# Patient Record
Sex: Female | Born: 1955 | Race: White | Hispanic: No | Marital: Married | Smoking: Never smoker
Health system: Southern US, Community
[De-identification: ages and names within clinical notes are randomized; demographics above are authoritative.]

## PROBLEM LIST (undated history)

## (undated) DIAGNOSIS — Q249 Congenital malformation of heart, unspecified: Secondary | ICD-10-CM

## (undated) DIAGNOSIS — Z2913 Encounter for prophylactic Rho(D) immune globulin: Secondary | ICD-10-CM

## (undated) DIAGNOSIS — B1920 Unspecified viral hepatitis C without hepatic coma: Secondary | ICD-10-CM

## (undated) DIAGNOSIS — T7840XA Allergy, unspecified, initial encounter: Secondary | ICD-10-CM

## (undated) DIAGNOSIS — B029 Zoster without complications: Secondary | ICD-10-CM

## (undated) DIAGNOSIS — J301 Allergic rhinitis due to pollen: Secondary | ICD-10-CM

## (undated) DIAGNOSIS — Z9289 Personal history of other medical treatment: Secondary | ICD-10-CM

## (undated) DIAGNOSIS — N200 Calculus of kidney: Secondary | ICD-10-CM

## (undated) DIAGNOSIS — I10 Essential (primary) hypertension: Secondary | ICD-10-CM

## (undated) DIAGNOSIS — Z9889 Other specified postprocedural states: Secondary | ICD-10-CM

## (undated) DIAGNOSIS — K759 Inflammatory liver disease, unspecified: Secondary | ICD-10-CM

## (undated) DIAGNOSIS — B019 Varicella without complication: Secondary | ICD-10-CM

## (undated) HISTORY — DX: Zoster without complications: B02.9

## (undated) HISTORY — DX: Other specified postprocedural states: Z98.890

## (undated) HISTORY — DX: Calculus of kidney: N20.0

## (undated) HISTORY — DX: Allergy, unspecified, initial encounter: T78.40XA

## (undated) HISTORY — DX: Varicella without complication: B01.9

## (undated) HISTORY — DX: Encounter for prophylactic Rho(d) immune globulin: Z29.13

## (undated) HISTORY — PX: KIDNEY STONE SURGERY: SHX686

## (undated) HISTORY — DX: Personal history of other medical treatment: Z92.89

## (undated) HISTORY — DX: Essential (primary) hypertension: I10

## (undated) HISTORY — DX: Allergic rhinitis due to pollen: J30.1

## (undated) HISTORY — DX: Unspecified viral hepatitis C without hepatic coma: B19.20

## (undated) HISTORY — DX: Congenital malformation of heart, unspecified: Q24.9

---
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
## 1996-06-16 HISTORY — PX: OVARIAN CYST REMOVAL: SHX89

## 1998-02-23 ENCOUNTER — Other Ambulatory Visit: Admission: RE | Admit: 1998-02-23 | Discharge: 1998-02-23 | Payer: Self-pay | Admitting: *Deleted

## 2002-09-01 ENCOUNTER — Encounter: Admission: RE | Admit: 2002-09-01 | Discharge: 2002-09-01 | Payer: Self-pay | Admitting: Family Medicine

## 2002-09-01 ENCOUNTER — Encounter: Payer: Self-pay | Admitting: Family Medicine

## 2005-12-11 ENCOUNTER — Other Ambulatory Visit: Admission: RE | Admit: 2005-12-11 | Discharge: 2005-12-11 | Payer: Self-pay | Admitting: Obstetrics and Gynecology

## 2011-06-17 DIAGNOSIS — Z9289 Personal history of other medical treatment: Secondary | ICD-10-CM

## 2011-06-17 HISTORY — PX: LITHOTRIPSY: SUR834

## 2011-06-17 HISTORY — DX: Personal history of other medical treatment: Z92.89

## 2011-12-15 DIAGNOSIS — B029 Zoster without complications: Secondary | ICD-10-CM

## 2011-12-15 HISTORY — DX: Zoster without complications: B02.9

## 2012-05-10 ENCOUNTER — Ambulatory Visit (INDEPENDENT_AMBULATORY_CARE_PROVIDER_SITE_OTHER): Payer: 59 | Admitting: Internal Medicine

## 2012-05-10 ENCOUNTER — Encounter: Payer: Self-pay | Admitting: Internal Medicine

## 2012-05-10 VITALS — BP 144/90 | HR 84 | Temp 98.3°F | Ht 62.75 in | Wt 192.0 lb

## 2012-05-10 DIAGNOSIS — Z8679 Personal history of other diseases of the circulatory system: Secondary | ICD-10-CM

## 2012-05-10 DIAGNOSIS — Q211 Atrial septal defect, unspecified: Secondary | ICD-10-CM | POA: Insufficient documentation

## 2012-05-10 DIAGNOSIS — Q638 Other specified congenital malformations of kidney: Secondary | ICD-10-CM

## 2012-05-10 DIAGNOSIS — L989 Disorder of the skin and subcutaneous tissue, unspecified: Secondary | ICD-10-CM

## 2012-05-10 DIAGNOSIS — Z8249 Family history of ischemic heart disease and other diseases of the circulatory system: Secondary | ICD-10-CM

## 2012-05-10 DIAGNOSIS — Z6834 Body mass index (BMI) 34.0-34.9, adult: Secondary | ICD-10-CM

## 2012-05-10 DIAGNOSIS — Q2111 Secundum atrial septal defect: Secondary | ICD-10-CM

## 2012-05-10 DIAGNOSIS — Z87442 Personal history of urinary calculi: Secondary | ICD-10-CM

## 2012-05-10 DIAGNOSIS — Z8774 Personal history of (corrected) congenital malformations of heart and circulatory system: Secondary | ICD-10-CM

## 2012-05-10 DIAGNOSIS — R030 Elevated blood-pressure reading, without diagnosis of hypertension: Secondary | ICD-10-CM | POA: Insufficient documentation

## 2012-05-10 DIAGNOSIS — Q631 Lobulated, fused and horseshoe kidney: Secondary | ICD-10-CM

## 2012-05-10 DIAGNOSIS — J3089 Other allergic rhinitis: Secondary | ICD-10-CM

## 2012-05-10 DIAGNOSIS — J309 Allergic rhinitis, unspecified: Secondary | ICD-10-CM

## 2012-05-10 DIAGNOSIS — Z299 Encounter for prophylactic measures, unspecified: Secondary | ICD-10-CM

## 2012-05-10 LAB — BASIC METABOLIC PANEL
BUN: 8 mg/dL (ref 6–23)
CO2: 26 mEq/L (ref 19–32)
Calcium: 9.2 mg/dL (ref 8.4–10.5)
Creatinine, Ser: 0.7 mg/dL (ref 0.4–1.2)
Glucose, Bld: 92 mg/dL (ref 70–99)
Potassium: 4.3 mEq/L (ref 3.5–5.1)

## 2012-05-10 LAB — HEPATIC FUNCTION PANEL
AST: 46 U/L — ABNORMAL HIGH (ref 0–37)
Albumin: 4.4 g/dL (ref 3.5–5.2)
Alkaline Phosphatase: 72 U/L (ref 39–117)
Bilirubin, Direct: 0.1 mg/dL (ref 0.0–0.3)
Total Bilirubin: 0.6 mg/dL (ref 0.3–1.2)
Total Protein: 7.7 g/dL (ref 6.0–8.3)

## 2012-05-10 LAB — LIPID PANEL
HDL: 47.5 mg/dL (ref >39.00)
LDL Cholesterol: 118 mg/dL — ABNORMAL HIGH (ref 0–99)
Total CHOL/HDL Ratio: 4
Triglycerides: 122 mg/dL (ref 0.0–149.0)
VLDL: 24.4 mg/dL (ref 0.0–40.0)

## 2012-05-10 LAB — CBC WITH DIFFERENTIAL/PLATELET
Basophils Absolute: 0.1 10*3/uL (ref 0.0–0.1)
Basophils Relative: 0.7 % (ref 0.0–3.0)
Eosinophils Absolute: 0.2 10*3/uL (ref 0.0–0.7)
Hemoglobin: 15.1 g/dL — ABNORMAL HIGH (ref 12.0–15.0)
Lymphocytes Relative: 33.5 % (ref 12.0–46.0)
MCHC: 33.3 g/dL (ref 30.0–36.0)
Monocytes Relative: 5.7 % (ref 3.0–12.0)
Neutro Abs: 4.6 10*3/uL (ref 1.4–7.7)
Neutrophils Relative %: 58.2 % (ref 43.0–77.0)
Platelets: 270 10*3/uL (ref 150.0–400.0)
RBC: 5.22 Mil/uL — ABNORMAL HIGH (ref 3.87–5.11)
RDW: 12.8 % (ref 11.5–14.6)
WBC: 7.9 10*3/uL (ref 4.5–10.5)

## 2012-05-10 LAB — TSH: TSH: 1.43 u[IU]/mL (ref 0.35–5.50)

## 2012-05-10 NOTE — Patient Instructions (Addendum)
Advise consideration of colonoscopy for prevention colon cancer or at least do the stool cards.  Get a mammogram.   Labs today  Check bp readings at least 3-5 days per week. Record them and bring the machine and the recordings and to your next visit Scheduled for for preventive visit and bp check.  Someone will contact you about getting an echocardiogram in the meantime.  Healthy diet DASH diet healthy weight loss will be helpful to prevent vascular disease and control blood pressure. DASH Diet The DASH diet stands for "Dietary Approaches to Stop Hypertension." It is a healthy eating plan that has been shown to reduce high blood pressure (hypertension) in as little as 14 days, while also possibly providing other significant health benefits. These other health benefits include reducing the risk of breast cancer after menopause and reducing the risk of type 2 diabetes, heart disease, colon cancer, and stroke. Health benefits also include weight loss and slowing kidney failure in patients with chronic kidney disease.  DIET GUIDELINES  Limit salt (sodium). Your diet should contain less than 1500 mg of sodium daily.  Limit refined or processed carbohydrates. Your diet should include mostly whole grains. Desserts and added sugars should be used sparingly.  Include small amounts of heart-healthy fats. These types of fats include nuts, oils, and tub margarine. Limit saturated and trans fats. These fats have been shown to be harmful in the body. CHOOSING FOODS  The following food groups are based on a 2000 calorie diet. See your Registered Dietitian for individual calorie needs. Grains and Grain Products (6 to 8 servings daily)  Eat More Often: Whole-wheat bread, brown rice, whole-grain or wheat pasta, quinoa, popcorn without added fat or salt (air popped).  Eat Less Often: White bread, white pasta, white rice, cornbread. Vegetables (4 to 5 servings daily)  Eat More Often: Fresh, frozen, and canned  vegetables. Vegetables may be raw, steamed, roasted, or grilled with a minimal amount of fat.  Eat Less Often/Avoid: Creamed or fried vegetables. Vegetables in a cheese sauce. Fruit (4 to 5 servings daily)  Eat More Often: All fresh, canned (in natural juice), or frozen fruits. Dried fruits without added sugar. One hundred percent fruit juice ( cup [237 mL] daily).  Eat Less Often: Dried fruits with added sugar. Canned fruit in light or heavy syrup. [employer], Fish, and Poultry (2 servings or less daily. One serving is 3 to 4 oz [85-114 g]).  Eat More Often: Ninety percent or leaner ground beef, tenderloin, sirloin. Round cuts of beef, chicken breast, [place] breast. All fish. Grill, bake, or broil your meat. Nothing should be fried.  Eat Less Often/Avoid: Fatty cuts of meat, [place], or chicken leg, thigh, or wing. Fried cuts of meat or fish. Dairy (2 to 3 servings)  Eat More Often: Low-fat or fat-free milk, low-fat plain or light yogurt, reduced-fat or part-skim cheese.  Eat Less Often/Avoid: Milk (whole, 2%).Whole milk yogurt. Full-fat cheeses. Nuts, Seeds, and Legumes (4 to 5 servings per week)  Eat More Often: All without added salt.  Eat Less Often/Avoid: Salted nuts and seeds, canned beans with added salt. Fats and Sweets (limited)  Eat More Often: Vegetable oils, tub margarines without trans fats, sugar-free gelatin. Mayonnaise and salad dressings.  Eat Less Often/Avoid: Coconut oils, palm oils, butter, stick margarine, cream, half and half, cookies, candy, pie. FOR MORE INFORMATION The Dash Diet Eating Plan: www.dashdiet.org Document Released: 05/22/2011 Document Revised: 08/25/2011 Document Reviewed: 05/22/2011 [facility] Patient Information 2013 [place], [place].  How to Take Your Blood Pressure  These instructions are only for electronic home blood pressure machines. You will need:   An automatic or semi-automatic blood pressure machine.  Fresh batteries for the  blood pressure machine. HOW DO I USE THESE TOOLS TO CHECK MY BLOOD PRESSURE?   There are 2 numbers that make up your blood pressure. For example: 120/80.  The first number (120 in our example) is called the "systolic pressure." It is a measure of the pressure in your blood vessels when your heart is pumping blood.  The second number (80 in our example) is called the "diastolic pressure." It is a measure of the pressure in your blood vessels when your heart is resting between beats.  Before you buy a home blood pressure machine, check the size of your arm so you can buy the right size cuff. Here is how to check the size of your arm:  Use a tape measure that shows both inches and centimeters.  Wrap the tape measure around the middle upper part of your arm. You may need someone to help you measure right.  Write down your arm measurement in both inches and centimeters.  To measure your blood pressure right, it is important to have the right size cuff.  If your arm is up to 13 inches (37 to 34 centimeters), get an adult cuff size.  If your arm is 13 to 17 inches (35 to 44 centimeters), get a large adult cuff size.  If your arm is 17 to 20 inches (45 to 52 centimeters), get an adult thigh cuff.  Try to rest or relax for at least 30 minutes before you check your blood pressure.  Do not smoke.  Do not have any drinks with caffeine, such as:  Pop.  Coffee.  Tea.  Check your blood pressure in a quiet room.  Sit down and stretch out your arm on a table. Keep your arm at about the level of your heart. Let your arm relax. GETTING BLOOD PRESSURE READINGS  Make sure you remove any tight-fighting clothing from your arm. Wrap the cuff around your upper arm. Wrap it just above the bend, and above where you felt the pulse. You should be able to slip a finger between the cuff and your arm. If you cannot slip a finger in the cuff, it is too tight and should be removed and rewrapped.  Some  units requires you to manually pump up the arm cuff.  Automatic units inflate the cuff when you press a button.  Cuff deflation is automatic in both models.  After the cuff is inflated, the unit measures your blood pressure and pulse. The readings are displayed on a monitor. Hold still and breathe normally while the cuff is inflated.  Getting a reading takes less than a minute.  Some models store readings in a memory. Some provide a printout of readings.  Get readings at different times of the day. You should wait at least 5 minutes between readings. Take readings with you to your next doctor's visit. Document Released: 05/15/2008 Document Revised: 08/25/2011 Document Reviewed: 05/15/2008 [facility] Patient Information 2013 [place], [place].

## 2012-05-10 NOTE — Progress Notes (Signed)
Chief Complaint  Patient presents with  . Establish Care    HPI: Patient comes in today as a new patient visit. She has no regular primary care physician saw gynecologist Dr. Senaida Ores in 2007 and was seen at [facility] in 2004. Since that time she has not been seen in ongoing care just urgent care and mini clinics. She is originally from [facility] and his mood varies places but has been in this area since 1997. 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   In July of 2013 she presented to mini clinic with burning pain that eventually was diagnosed with shingles although at that time she had had some hematuria and pain that was reminiscent of kidney stones that she had had in 1990. At that time her blood pressure readings are very elevated in the 190 range on followup they were high but not that high and was told to get a primary doctor for evaluation and treatment if needed.  had valtrex for the shingles  pain lasted for 8 weeks seen at mini clinic and had elevated bp 190 and then 140.   There is a family history of hypertension in both parents. Father cabg age 9 range.  He also had many mini strokes and dementia.  HAs; about once every 2 weeks or so.  Seem stress related . Also hx of migraines.   ROS: See pertinent positives and negatives per HPI. She denies major problems with her vision hearing chest pain shortness of breath syncope inability to exercise except for time. She has allergies to respond best to Allegra has not been on nasal cortisone she describes these as perennial test with a blood test no asthma.  She's had a tender blood collection on the arch of her left foot and was seen by podiatry and had it aspirated which helped her pain however it is coming back wonders who should evaluate it she has no pain at this time and no new trauma     She has a history of open-heart surgery reports having had an ASD VSD and mild  pulmonary stenosis at age 54 her VSD had closed the atrial defect was repaired sewn without a patch and was told that she didn't need to be seen anymore nor followed up. She's not had a value of since since that time it was told she may have a functional murmur she's been on amoxicillin prophylaxis 500 mg x4 for dental procedure since that time.  Past Medical History  Diagnosis Date  . Chicken pox   . Shingles 12/2011  . Hay fever   . Congenital heart defect   . Heart murmur   . High blood pressure   . Kidney stones 1610,9604  . Migraine     Family History  Problem Relation Age of Onset  . Arthritis Mother     Degenerative disc disease  . Hyperlipidemia Mother   . Hypertension Mother   . Kidney disease Mother   . Mental illness Mother   . Arthritis Father   . Hyperlipidemia Father   . Heart disease Father   . Stroke Father   . Hypertension Father   . Diabetes Father   . Alzheimer's disease Father   . Glaucoma    . Macular degeneration      History   Social History  . Marital Status: Married    Spouse Name: N/A    Number of Children: N/A  .  Years of Education: N/A   Social History Main Topics  . Smoking status: Never Smoker   . Smokeless tobacco: None  . Alcohol Use: Yes  . Drug Use: None  . Sexually Active: None   Other Topics Concern  . 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 amoxicillin (AMOXIL) 500 MG capsule Take 500 mg by mouth. For dental procedures      . fexofenadine (ALLEGRA) 180 MG tablet Take 180 mg by mouth daily.        EXAM:  BP 144/90  Pulse 84  Temp 98.3 F (36.8  C) (Oral)  Ht 5' 2.75" (1.594 m)  Wt 192 lb (87.091 kg)  BMI 34.28 kg/m2  SpO2 97%  Body mass index is 34.28 kg/(m^2).  GENERAL: vitals reviewed and listed above, alert, oriented, appears well hydrated and in no acute distress  HEENT: atraumatic, conjunctiva  clear, no obvious abnormalities on inspection of external nose and ears OP : no lesion edema or exudate nares patent TMs intact dental in repair  NECK: no obvious masses on inspection palpation no bruits heard supple  LUNGS: clear to auscultation bilaterally, no wheezes, rales or rhonchi, good air movement Abdomen:  Sof,t normal bowel sounds without hepatosplenomegaly, no guarding rebound or masses no CVA tenderness CV: HRRR, no clubbing cyanosis or  peripheral edema nl cap refill  pulses seem equal in both upper strategies Neuro grossly normal MS: moves all extremities without noticeable focal  abnormality Skin no acute changes normal turgor left foot with a 2 cm venous bluish nodular nontender lump like a venous hemangioma nonfluctuant. PSYCH: pleasant and cooperative, no obvious depression or anxiety  ASSESSMENT AND PLAN:  Discussed the following assessment and plan:  1. Elevated blood pressure  fexofenadine (ALLEGRA) 180 MG tablet, amoxicillin (AMOXIL) 500 MG capsule, Basic metabolic panel, CBC with Differential, Hepatic function panel, Lipid panel, TSH, 2D Echocardiogram without contrast   140 and 190 when sick.   2. ASD (atrial septal defect)  hx of   fexofenadine (ALLEGRA) 180 MG tablet, amoxicillin (AMOXIL) 500 MG capsule, Basic metabolic panel, CBC with Differential, Hepatic function panel, Lipid panel, TSH, 2D Echocardiogram without contrast   repaired.  had vsd spontaneous resolution and PS that resolved.   3. History of renal stone  fexofenadine (ALLEGRA) 180 MG tablet, amoxicillin (AMOXIL) 500 MG capsule, Basic metabolic panel, CBC with Differential, Hepatic function panel, Lipid panel, TSH   runs in family /passed  not calcium based 1990  horshoe kidneys  lower . ? 2013   4. Perennial allergic rhinitis  fexofenadine (ALLEGRA) 180 MG tablet, amoxicillin (AMOXIL) 500 MG capsule, Basic metabolic panel, CBC with Differential, Hepatic function panel, Lipid panel, TSH   blood testing  in [place]  1990 saline and allegra.   5. Preventive measure  fexofenadine (ALLEGRA) 180 MG tablet, amoxicillin (AMOXIL) 500 MG capsule, Basic metabolic panel, CBC with Differential, Hepatic function panel, Lipid panel, TSH   Delay preventive measures no primary care for a number of years. Get labs today followup for preventive visit with Pap smear get mammo-stool cards given  6. H/O congenital heart disease  2D Echocardiogram without contrast   asd/vsd mild PS  resolved after asd repair when 17 years.   7. Family history of cardiovascular disorder    8. Horseshoe kidney    9. Foot lesion    10. BMI 34.0-34.9,adult     hasn't done colonoscopy.   Advise  -Patient advised to return or notify health care team  immediately if symptoms worsen or persist or new concerns arise.  Patient Instructions  Advise consideration of colonoscopy for prevention colon cancer or at least do the stool cards.  Get a mammogram.   Labs today  Check bp readings at least 3-5 days per week. Record them and bring the machine and the recordings and to your next visit Scheduled for for preventive visit and bp check.  Someone will contact you about getting an echocardiogram in the meantime.  Healthy diet DASH diet healthy weight loss will be helpful to prevent vascular disease and control blood pressure. DASH Diet The DASH diet stands for "Dietary Approaches to Stop Hypertension." It is a healthy eating plan that has been shown to reduce high blood pressure (hypertension) in as little as 14 days, while also possibly providing other significant health benefits. These other health benefits include reducing the risk of breast cancer after menopause and  reducing the risk of type 2 diabetes, heart disease, colon cancer, and stroke. Health benefits also include weight loss and slowing kidney failure in patients with chronic kidney disease.  DIET GUIDELINES  Limit salt (sodium). Your diet should contain less than 1500 mg of sodium daily.  Limit refined or processed carbohydrates. Your diet should include mostly whole grains. Desserts and added sugars should be used sparingly.  Include small amounts of heart-healthy fats. These types of fats include nuts, oils, and tub margarine. Limit saturated and trans fats. These fats have been shown to be harmful in the body. CHOOSING FOODS  The following food groups are based on a 2000 calorie diet. See your Registered Dietitian for individual calorie needs. Grains and Grain Products (6 to 8 servings daily)  Eat More Often: Whole-wheat bread, brown rice, whole-grain or wheat pasta, quinoa, popcorn without added fat or salt (air popped).  Eat Less Often: White bread, white pasta, white rice, cornbread. Vegetables (4 to 5 servings daily)  Eat More Often: Fresh, frozen, and canned vegetables. Vegetables may be raw, steamed, roasted, or grilled with a minimal amount of fat.  Eat Less Often/Avoid: Creamed or fried vegetables. Vegetables in a cheese sauce. Fruit (4 to 5 servings daily)  Eat More Often: All fresh, canned (in natural juice), or frozen fruits. Dried fruits without added sugar. One hundred percent fruit juice ( cup [237 mL] daily).  Eat Less Often: Dried fruits with added sugar. Canned fruit in light or heavy syrup. [employer], Fish, and Poultry (2 servings or less daily. One serving is 3 to 4 oz [85-114 g]).  Eat More Often: Ninety percent or leaner ground beef, tenderloin, sirloin. Round cuts of beef, chicken breast, [place] breast. All fish. Grill, bake, or broil your meat. Nothing should be fried.  Eat Less Often/Avoid: Fatty cuts of meat, [place], or chicken leg, thigh, or wing. Fried cuts  of meat or fish. Dairy (2 to 3 servings)  Eat More Often: Low-fat or fat-free milk, low-fat plain or light yogurt, reduced-fat or part-skim cheese.  Eat Less Often/Avoid: Milk (whole, 2%).Whole milk yogurt. Full-fat cheeses. Nuts, Seeds, and Legumes (4 to 5 servings per week)  Eat More Often: All without added  salt.  Eat Less Often/Avoid: Salted nuts and seeds, canned beans with added salt. Fats and Sweets (limited)  Eat More Often: Vegetable oils, tub margarines without trans fats, sugar-free gelatin. Mayonnaise and salad dressings.  Eat Less Often/Avoid: Coconut oils, palm oils, butter, stick margarine, cream, half and half, cookies, candy, pie. FOR MORE INFORMATION The Dash Diet Eating Plan: www.dashdiet.org Document Released: 05/22/2011 Document Revised: 08/25/2011 Document Reviewed: 05/22/2011 [facility] Patient Information 2013 [place], [place].    How to Take Your Blood Pressure  These instructions are only for electronic home blood pressure machines. You will need:   An automatic or semi-automatic blood pressure machine.  Fresh batteries for the blood pressure machine. HOW DO I USE THESE TOOLS TO CHECK MY BLOOD PRESSURE?   There are 2 numbers that make up your blood pressure. For example: 120/80.  The first number (120 in our example) is called the "systolic pressure." It is a measure of the pressure in your blood vessels when your heart is pumping blood.  The second number (80 in our example) is called the "diastolic pressure." It is a measure of the pressure in your blood vessels when your heart is resting between beats.  Before you buy a home blood pressure machine, check the size of your arm so you can buy the right size cuff. Here is how to check the size of your arm:  Use a tape measure that shows both inches and centimeters.  Wrap the tape measure around the middle upper part of your arm. You may need someone to help you measure right.  Write down your arm  measurement in both inches and centimeters.  To measure your blood pressure right, it is important to have the right size cuff.  If your arm is up to 13 inches (37 to 34 centimeters), get an adult cuff size.  If your arm is 13 to 17 inches (35 to 44 centimeters), get a large adult cuff size.  If your arm is 17 to 20 inches (45 to 52 centimeters), get an adult thigh cuff.  Try to rest or relax for at least 30 minutes before you check your blood pressure.  Do not smoke.  Do not have any drinks with caffeine, such as:  Pop.  Coffee.  Tea.  Check your blood pressure in a quiet room.  Sit down and stretch out your arm on a table. Keep your arm at about the level of your heart. Let your arm relax. GETTING BLOOD PRESSURE READINGS  Make sure you remove any tight-fighting clothing from your arm. Wrap the cuff around your upper arm. Wrap it just above the bend, and above where you felt the pulse. You should be able to slip a finger between the cuff and your arm. If you cannot slip a finger in the cuff, it is too tight and should be removed and rewrapped.  Some units requires you to manually pump up the arm cuff.  Automatic units inflate the cuff when you press a button.  Cuff deflation is automatic in both models.  After the cuff is inflated, the unit measures your blood pressure and pulse. The readings are displayed on a monitor. Hold still and breathe normally while the cuff is inflated.  Getting a reading takes less than a minute.  Some models store readings in a memory. Some provide a printout of readings.  Get readings at different times of the day. You should wait at least 5 minutes between readings. Take readings with you to  your next doctor's visit. Document Released: 05/15/2008 Document Revised: 08/25/2011 Document Reviewed: 05/15/2008 [facility] Patient Information 2013 [place], [place].    Neta Mends. Katerina Zurn M.D.

## 2012-05-14 DIAGNOSIS — L989 Disorder of the skin and subcutaneous tissue, unspecified: Secondary | ICD-10-CM | POA: Insufficient documentation

## 2012-05-14 DIAGNOSIS — Q631 Lobulated, fused and horseshoe kidney: Secondary | ICD-10-CM | POA: Insufficient documentation

## 2012-05-14 DIAGNOSIS — Z6832 Body mass index (BMI) 32.0-32.9, adult: Secondary | ICD-10-CM | POA: Insufficient documentation

## 2012-05-21 ENCOUNTER — Ambulatory Visit (HOSPITAL_COMMUNITY): Payer: 59 | Attending: Internal Medicine | Admitting: Radiology

## 2012-05-21 DIAGNOSIS — Z8249 Family history of ischemic heart disease and other diseases of the circulatory system: Secondary | ICD-10-CM | POA: Insufficient documentation

## 2012-05-21 DIAGNOSIS — Z8774 Personal history of (corrected) congenital malformations of heart and circulatory system: Secondary | ICD-10-CM

## 2012-05-21 DIAGNOSIS — R011 Cardiac murmur, unspecified: Secondary | ICD-10-CM | POA: Insufficient documentation

## 2012-05-21 DIAGNOSIS — I10 Essential (primary) hypertension: Secondary | ICD-10-CM | POA: Insufficient documentation

## 2012-05-21 DIAGNOSIS — Q211 Atrial septal defect: Secondary | ICD-10-CM

## 2012-05-21 NOTE — Progress Notes (Signed)
Echocardiogram performed.  

## 2012-06-30 ENCOUNTER — Other Ambulatory Visit: Payer: Self-pay | Admitting: Internal Medicine

## 2012-06-30 DIAGNOSIS — Z1231 Encounter for screening mammogram for malignant neoplasm of breast: Secondary | ICD-10-CM

## 2012-07-02 ENCOUNTER — Ambulatory Visit
Admission: RE | Admit: 2012-07-02 | Discharge: 2012-07-02 | Disposition: A | Discharge status: Home / Self Care | Payer: 59 | Source: Ambulatory Visit | Attending: Internal Medicine | Admitting: Internal Medicine

## 2012-07-02 DIAGNOSIS — Z1231 Encounter for screening mammogram for malignant neoplasm of breast: Secondary | ICD-10-CM

## 2012-07-07 ENCOUNTER — Encounter: Payer: Self-pay | Admitting: Internal Medicine

## 2012-07-07 ENCOUNTER — Other Ambulatory Visit (HOSPITAL_COMMUNITY)
Admission: RE | Admit: 2012-07-07 | Discharge: 2012-07-07 | Disposition: A | Discharge status: Home / Self Care | Payer: 59 | Source: Ambulatory Visit | Attending: Internal Medicine | Admitting: Internal Medicine

## 2012-07-07 ENCOUNTER — Other Ambulatory Visit: Payer: Self-pay | Admitting: Internal Medicine

## 2012-07-07 ENCOUNTER — Ambulatory Visit (INDEPENDENT_AMBULATORY_CARE_PROVIDER_SITE_OTHER): Payer: 59 | Admitting: Internal Medicine

## 2012-07-07 VITALS — BP 154/96 | HR 78 | Temp 98.3°F | Ht 62.75 in | Wt 189.0 lb

## 2012-07-07 DIAGNOSIS — Z6833 Body mass index (BMI) 33.0-33.9, adult: Secondary | ICD-10-CM

## 2012-07-07 DIAGNOSIS — Z01419 Encounter for gynecological examination (general) (routine) without abnormal findings: Secondary | ICD-10-CM | POA: Insufficient documentation

## 2012-07-07 DIAGNOSIS — M21619 Bunion of unspecified foot: Secondary | ICD-10-CM

## 2012-07-07 DIAGNOSIS — R7989 Other specified abnormal findings of blood chemistry: Secondary | ICD-10-CM

## 2012-07-07 DIAGNOSIS — R030 Elevated blood-pressure reading, without diagnosis of hypertension: Secondary | ICD-10-CM

## 2012-07-07 DIAGNOSIS — Z8774 Personal history of (corrected) congenital malformations of heart and circulatory system: Secondary | ICD-10-CM

## 2012-07-07 DIAGNOSIS — Z1151 Encounter for screening for human papillomavirus (HPV): Secondary | ICD-10-CM | POA: Insufficient documentation

## 2012-07-07 DIAGNOSIS — M21611 Bunion of right foot: Secondary | ICD-10-CM

## 2012-07-07 DIAGNOSIS — R945 Abnormal results of liver function studies: Secondary | ICD-10-CM

## 2012-07-07 DIAGNOSIS — Z8679 Personal history of other diseases of the circulatory system: Secondary | ICD-10-CM

## 2012-07-07 DIAGNOSIS — Z0000 Encounter for general adult medical examination without abnormal findings: Secondary | ICD-10-CM

## 2012-07-07 LAB — HEPATIC FUNCTION PANEL
ALT: 35 U/L (ref 0–35)
Albumin: 4.3 g/dL (ref 3.5–5.2)
Alkaline Phosphatase: 72 U/L (ref 39–117)
Bilirubin, Direct: 0.1 mg/dL (ref 0.0–0.3)
Total Protein: 7.2 g/dL (ref 6.0–8.3)

## 2012-07-07 NOTE — Progress Notes (Signed)
Chief Complaint  Patient presents with  . Annual Exam    HPI: Patient comes in today for Preventive Health Care visit  No major change in health status since last visit . Recovered from shingles   ocass twinge leg otherwise ok .  Has bunion right ocass pain . Brings in bp monitor wrist and readings  Reviewed in the 130 /80 range  ocass 140 borderline  ROS:  GEN/ HEENT: No fever, significant weight changes sweats headaches vision problems hearing changes, CV/ PULM; No chest pain shortness of breath cough, syncope,edema  change in exercise tolerance. GI /GU: No adominal pain, vomiting, change in bowel habits. No blood in the stool. No significant GU symptoms. SKIN/HEME: ,no acute skin rashes suspicious lesions or bleeding. No lymphadenopathy, nodules, masses.  NEURO/ PSYCH:  No neurologic signs such as weakness numbness. No depression anxiety. IMM/ Allergy: No unusual infections.  Allergy .   REST of 12 system review negative except as per HPI   Past Medical History  Diagnosis Date  . Chicken pox   . Shingles 12/2011  . Hay fever   . Congenital heart defect   . High blood pressure   . Kidney stones 1610,9604  . Migraine   . Hx of heart surgery     asd closure  . Hx of echocardiogram 2013    nl except 2 diastolic dysfunction nl lv   no pht,no asd    Family History  Problem Relation Age of Onset  . Arthritis Mother     Degenerative disc disease  . Hyperlipidemia Mother   . Hypertension Mother   . Kidney disease Mother   . Mental illness Mother   . Arthritis Father   . Hyperlipidemia Father   . Heart disease Father   . Stroke Father   . Hypertension Father   . Diabetes Father   . Alzheimer's disease Father   . Glaucoma    . Macular degeneration      History   Social History  . Marital Status: Married    Spouse Name: N/A    Number of Children: N/A  . Years of Education: N/A   Social History Main Topics  . Smoking status: Never Smoker   . Smokeless tobacco:  None  . Alcohol Use: Yes  . Drug Use: None  . Sexually Active: None   Other Topics Concern  . 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 fexofenadine (ALLEGRA) 180 MG tablet Take 180 mg by mouth daily.      Marland Kitchen amoxicillin (AMOXIL) 500 MG capsule Take 500 mg by mouth. For dental procedures        EXAM:  BP 154/96  Pulse 78  Temp 98.3 F (36.8 C) (Oral)  Ht 5' 2.75" (1.594 m)  Wt 189 lb (85.73 kg)  BMI 33.75 kg/m2  SpO2 98%  Body mass index is 33.75 kg/(m^2). Wt Readings from Last 3 Encounters:  07/07/12 189 lb (85.73 kg)  05/10/12 192 lb (87.091 kg)  BP readings :158/80 164/82  157/84  Cuff and wrist monitor  Pulse in 70 range  Physical Exam: Vital signs reviewed [id number]  is a well-developed well-nourished alert cooperative   female who appears her stated age in no acute distress.  HEENT: normocephalic atraumatic , Eyes: PERRL EOM's full, conjunctiva clear, Nares: paten,t no deformity discharge or tenderness., Ears: no deformity EAC's clear TMs with normal landmarks. Mouth: clear OP, no lesions, edema.  Moist mucous membranes. Dentition in adequate repair. NECK: supple without masses, thyromegaly or bruits. CHEST/PULM:  Clear to auscultation and percussion breath sounds equal no wheeze , rales or rhonchi. No chest wall deformities or tenderness. CV: PMI is nondisplaced, S1 S2 no gallops, murmurs, rubs. Peripheral pulses are full without delay.No JVD .  Breast: normal by inspection . No dimpling, discharge, masses, tenderness or discharge.  ABDOMEN: Bowel sounds normal nontender  No guard or rebound, no  hepato splenomegal no CVA tenderness.  No hernia. Extremtities:  No clubbing cyanosis or edema, no acute joint swelling or redness no focal atrophy foot right bunion mild redness no pain or swelling NEURO:  Oriented x3, cranial nerves 3-12 appear to be intact, no obvious focal weakness,gait within normal limits no abnormal reflexes or asymmetrical SKIN: No acute rashes normal turgor, color, no bruising or petechiae. PSYCH: Oriented, good eye contact, no obvious depression anxiety, cognition and judgment appear normal. LN: no cervical axillary inguinal adenopathy Pelvic: NL ext GU, labia clear without lesions or rash . Vagina no lesions .Cervix: clear  UTERUS: Neg CMT Adnexa:  clear no masses . PAP done high risk hpv screen  Lab Results  Component Value Date   WBC 7.9 05/10/2012   HGB 15.1* 05/10/2012   HCT 45.2 05/10/2012   PLT 270.0 05/10/2012   GLUCOSE 92 05/10/2012   CHOL 190 05/10/2012   TRIG 122.0 05/10/2012   HDL 47.50 05/10/2012   LDLCALC 118* 05/10/2012   ALT 35 07/07/2012   AST 34 07/07/2012   NA 137 05/10/2012   K 4.3 05/10/2012   CL 102 05/10/2012   CREATININE 0.7 05/10/2012   BUN 8 05/10/2012   CO2 26 05/10/2012   TSH 1.43 05/10/2012    ASSESSMENT AND PLAN:  Discussed the following assessment and plan:  1. Visit for preventive health examination  Cytology - PAP, Hepatic function panel, Hepatitis B surface antibody, Hepatitis B surface antigen, Hep C Antibody  2. Abnormal LFTs  Cytology - PAP, Hepatic function panel, Hepatitis B surface antibody, Hepatitis B surface antigen, Hep C Antibody   recheck today  may have been from nsaid tylenol with shingles rx will follow   3. Elevated blood pressure     normal  at home except a few 140s  wrist machine correlates with out in office readings .lsi and if still up add medication  4. Routine gynecological examination    5. BMI 33.0-33.9,adult     counseling  6. H/O congenital heart disease     Wrist machine seems to  correlate with office  Arm cuff.   Has white coat component  Nl at home  However lose weight and monitor has dias dysf  on echo Patient Care Team: Madelin Headings, MD as PCP - General (Internal Medicine) Patient Instructions  Continue lifestyle intervention healthy eating and exercise . Monitor blood pressure   To ensure  Ok.  We can add medication if needed. Weigh loss can work like a blood pressure medicine and lower blood pressure. Below 140/90  It the goal . FU depending on labs and how you are doing .  6  months .    Neta Mends. Maleak Brazzel M.D. Health  Maintenance  Topic Date Due  . Colonoscopy  10/21/2005  . Pap Smear  12/10/2008  . Influenza Vaccine  02/15/2012  . Mammogram  07/02/2014  . Tetanus/tdap  09/18/2021   Health Maintenance Review

## 2012-07-07 NOTE — Patient Instructions (Addendum)
Continue lifestyle intervention healthy eating and exercise . Monitor blood pressure   To ensure  Ok.  We can add medication if needed. Weigh loss can work like a blood pressure medicine and lower blood pressure. Below 140/90  It the goal . FU depending on labs and how you are doing .  6  months .

## 2012-07-19 NOTE — Progress Notes (Signed)
Quick Note:  Tell patient PAP is normal. hpv negative ( see other result note regarding HEP C) ______

## 2012-07-21 ENCOUNTER — Encounter: Payer: Self-pay | Admitting: Family Medicine

## 2012-07-21 ENCOUNTER — Other Ambulatory Visit: Payer: Self-pay | Admitting: Family Medicine

## 2012-07-21 DIAGNOSIS — R768 Other specified abnormal immunological findings in serum: Secondary | ICD-10-CM

## 2012-07-23 ENCOUNTER — Other Ambulatory Visit (INDEPENDENT_AMBULATORY_CARE_PROVIDER_SITE_OTHER): Payer: 59

## 2012-07-23 DIAGNOSIS — R894 Abnormal immunological findings in specimens from other organs, systems and tissues: Secondary | ICD-10-CM

## 2012-07-23 DIAGNOSIS — R768 Other specified abnormal immunological findings in serum: Secondary | ICD-10-CM

## 2012-07-23 LAB — IBC PANEL
Iron: 70 ug/dL (ref 42–145)
Transferrin: 274.1 mg/dL (ref 212.0–360.0)

## 2012-07-23 LAB — FERRITIN: Ferritin: 45.9 ng/mL (ref 10.0–291.0)

## 2012-07-23 LAB — HEPATITIS C ANTIBODY: HCV Ab: REACTIVE — AB

## 2012-07-23 LAB — HIV ANTIBODY (ROUTINE TESTING W REFLEX): HIV: NONREACTIVE

## 2012-07-26 LAB — HEPATITIS C RNA QUANTITATIVE: HCV Quantitative Log: 6.16 {Log} — ABNORMAL HIGH (ref <1.18)

## 2012-07-31 ENCOUNTER — Other Ambulatory Visit: Payer: Self-pay

## 2012-08-02 ENCOUNTER — Ambulatory Visit (INDEPENDENT_AMBULATORY_CARE_PROVIDER_SITE_OTHER): Payer: 59 | Admitting: Internal Medicine

## 2012-08-02 ENCOUNTER — Encounter: Payer: Self-pay | Admitting: Internal Medicine

## 2012-08-02 VITALS — BP 144/90 | HR 77 | Temp 97.9°F | Wt 187.0 lb

## 2012-08-02 DIAGNOSIS — R030 Elevated blood-pressure reading, without diagnosis of hypertension: Secondary | ICD-10-CM

## 2012-08-02 DIAGNOSIS — B1920 Unspecified viral hepatitis C without hepatic coma: Secondary | ICD-10-CM

## 2012-08-02 DIAGNOSIS — Z9189 Other specified personal risk factors, not elsewhere classified: Secondary | ICD-10-CM

## 2012-08-02 DIAGNOSIS — Z9289 Personal history of other medical treatment: Secondary | ICD-10-CM

## 2012-08-02 DIAGNOSIS — Z23 Encounter for immunization: Secondary | ICD-10-CM

## 2012-08-02 HISTORY — DX: Unspecified viral hepatitis C without hepatic coma: B19.20

## 2012-08-02 NOTE — Progress Notes (Signed)
Chief Complaint  Patient presents with  . Follow-up    Labs    HPI: Patient comes in today for followup of labs and positive hepatitis C antibody tests. See past notes and lab tests her transaminases came to normal with voiding anti-inflammatories Tylenol and alcohol. However hepatitis C was positive and had viral copies done which were also positive. She has no symptoms such as flulike fever extreme fatigue .  Has looked up potential history triggers such as a 4 unit blood transfusion in 57 with her heart surgery at age 57. Her father was one donor and she is uncertain if he could have had hep C but no diagnosis. Ovarian cyst surgery 99 tonsillectomy 95 history C-section RhoGAM and 2 pregnancies. No other exposures no tattoos although does have had ear piercings. Neg ivdu one partner  She is continue to monitor blood pressure and states that most of her readings are in normal range below 130/80. No new symptoms. ROS: See pertinent positives and negatives per HPI.  Past Medical History  Diagnosis Date  . Chicken pox   . Shingles 12/2011  . Hay fever   . Congenital heart defect   . High blood pressure   . Kidney stones 1610,9604  . Migraine   . Hx of heart surgery     asd closure  . Hx of echocardiogram 2013    nl except 2 diastolic dysfunction nl lv   no pht,no asd  . Transfusion history 1974    4 units  1 from father  heart surgery  . Need for rhogam due to Rh negative mother     x 2 1982 and 1986    Family History  Problem Relation Age of Onset  . Arthritis Mother     Degenerative disc disease  . Hyperlipidemia Mother   . Hypertension Mother   . Kidney disease Mother   . Mental illness Mother   . Arthritis Father   . Hyperlipidemia Father   . Heart disease Father   . Stroke Father   . Hypertension Father   . Diabetes Father   . Alzheimer's disease Father   . Glaucoma    . Macular degeneration      History   Social History  . Marital Status: Married    Spouse  Name: N/A    Number of Children: N/A  . Years of Education: N/A   Social History Main Topics  . Smoking status: Never Smoker   . Smokeless tobacco: None  . Alcohol Use: Yes  . Drug Use: None  . Sexually Active: None   Other Topics Concern  . 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   Last Pap 6 2007 last period 3 2000 and 5T depth for 5 2013 no recent mammogram no colonoscopy    Outpatient Encounter Prescriptions as of 08/02/2012  Medication Sig Dispense Refill  . amoxicillin (AMOXIL) 500 MG capsule Take 500 mg by mouth. For dental procedures      . fexofenadine (ALLEGRA) 180 MG tablet Take 180 mg by mouth daily.       No facility-administered encounter medications on file as of 08/02/2012.    EXAM:  BP 144/90  Pulse 77  Temp(Src) 97.9 F (36.6 C) (Oral)  Wt 187 lb (84.823 kg)  BMI 33.38 kg/m2  SpO2 98%  Body mass index is 33.38 kg/(m^2).  GENERAL: vitals reviewed and listed above, alert, oriented, appears well hydrated and in no acute distress Looks well non icteric  PSYCH: pleasant and cooperative, no obvious depression or anxiety Labs findings reviewed  Neg iron hiv pos RNA copies  6.16 log  But not  serotyped in result.  ASSESSMENT AND PLAN:  Discussed the following assessment and plan:  Hepatitis C infection - most likely from transfusion in 1970s  - Plan: AMB referral to hepatitis C clinic  Transfusion history - Plan: AMB referral to hepatitis C clinic  Elevated blood pressure - normal at home continue to monitor and follow up.   Need for hepatitis A and B vaccination - Plan: Hepatitis A hepatitis B combined vaccine IM Disc proceeding with more evaluation and rx as appropriate  Will need monitoring for [facility] get twin rix series started .  Plan  referral to hepatitis C clinic  .   For more advice and eval.   For treatment . -Patient advised to return or notify health care team  if symptoms worsen or persist or new concerns arise.  Patient Instructions  Begin twin rix  vaccine   against hep A and B   Series  0 -1- month    6 months from first.  Will be contact about   Referral to   Hepatitis  C  Specialty clinic   . Ultrasound and  other blood tests will be in order.       I would guess it is most likely  that this  Hepatitis could come from the tranfusion you had At age 60 .  Avoid liver toxic meds including   Tylenol  aleve alcohol .    Continue to monitor your blood pressure readings.    Neta Mends. Panosh M.D.  Total visit > 50% spent counseling and coordinating care

## 2012-08-02 NOTE — Patient Instructions (Addendum)
Begin twin rix  vaccine   against hep A and B   Series  0 -1- month    6 months from first.  Will be contact about   Referral to   Hepatitis  C  Specialty clinic   . Ultrasound and  other blood tests will be in order.       I would guess it is most likely  that this  Hepatitis could come from the tranfusion you had At age 57 .  Avoid liver toxic meds including   Tylenol  aleve alcohol .    Continue to monitor your blood pressure readings.

## 2012-08-10 ENCOUNTER — Encounter: Payer: Self-pay | Admitting: Internal Medicine

## 2012-08-24 ENCOUNTER — Encounter: Payer: Self-pay | Admitting: Internal Medicine

## 2012-08-24 ENCOUNTER — Other Ambulatory Visit: Payer: Self-pay | Admitting: Internal Medicine

## 2012-08-24 DIAGNOSIS — B1920 Unspecified viral hepatitis C without hepatic coma: Secondary | ICD-10-CM

## 2012-08-25 NOTE — Telephone Encounter (Signed)
Dx ;   abnormal lfts   Hepatitis C  Infection.  Hx of transfusion.

## 2012-08-30 ENCOUNTER — Ambulatory Visit (INDEPENDENT_AMBULATORY_CARE_PROVIDER_SITE_OTHER): Payer: 59 | Admitting: Family Medicine

## 2012-08-30 ENCOUNTER — Encounter: Payer: Self-pay | Admitting: Family Medicine

## 2012-08-30 DIAGNOSIS — Z23 Encounter for immunization: Secondary | ICD-10-CM

## 2012-08-31 ENCOUNTER — Ambulatory Visit
Admission: RE | Admit: 2012-08-31 | Discharge: 2012-08-31 | Disposition: A | Discharge status: Home / Self Care | Payer: 59 | Source: Ambulatory Visit | Attending: Internal Medicine | Admitting: Internal Medicine

## 2012-08-31 DIAGNOSIS — B1920 Unspecified viral hepatitis C without hepatic coma: Secondary | ICD-10-CM

## 2013-01-03 ENCOUNTER — Encounter: Payer: Self-pay | Admitting: Internal Medicine

## 2013-01-03 ENCOUNTER — Ambulatory Visit (INDEPENDENT_AMBULATORY_CARE_PROVIDER_SITE_OTHER): Payer: 59 | Admitting: Internal Medicine

## 2013-01-03 DIAGNOSIS — N200 Calculus of kidney: Secondary | ICD-10-CM | POA: Insufficient documentation

## 2013-01-03 DIAGNOSIS — K8020 Calculus of gallbladder without cholecystitis without obstruction: Secondary | ICD-10-CM | POA: Insufficient documentation

## 2013-01-03 DIAGNOSIS — R030 Elevated blood-pressure reading, without diagnosis of hypertension: Secondary | ICD-10-CM

## 2013-01-03 DIAGNOSIS — B1920 Unspecified viral hepatitis C without hepatic coma: Secondary | ICD-10-CM

## 2013-01-03 NOTE — Patient Instructions (Signed)
Can do urology referral. Someone will contact you about this. In case not helping.  Continue blood pressure  Monitoring   For reasons discussed .  Repeat Bp   Today was 138/84  .  preventive visit in 6 months or as needed

## 2013-01-03 NOTE — Progress Notes (Signed)
Chief Complaint  Patient presents with  . Follow-up    HPI: Patient comes in today for follow up of  multiple medical problems.  Since last visit  Has hadHep c visit  Dr Timothy Lasso   On short list  For summer  .  research oral med  . Has  1B serotype   Has gallstones.  On [place]  No sx and liver looks celar   Noted right 5mm non obstructing  renal stone.  Has hx of same . Recently had episodes   hematuria and kidney stone pain  .   Using fluids. ? Acid stone   Better after a week.  Not now .    BP :   At home  1237/78  High normal  ROS: See pertinent positives and negatives per HPI.  Past Medical History  Diagnosis Date  . Chicken pox   . Shingles 12/2011  . Hay fever   . Congenital heart defect   . High blood pressure   . Kidney stones 1191,4782  . Migraine   . Hx of heart surgery     asd closure  . Hx of echocardiogram 2013    nl except 2 diastolic dysfunction nl lv   no pht,no asd  . Transfusion history 1974    4 units  1 from father  heart surgery  . Need for rhogam due to Rh negative mother     x 2 1982 and 1986    Family History  Problem Relation Age of Onset  . Arthritis Mother     Degenerative disc disease  . Hyperlipidemia Mother   . Hypertension Mother   . Kidney disease Mother   . Mental illness Mother   . Arthritis Father   . Hyperlipidemia Father   . Heart disease Father   . Stroke Father   . Hypertension Father   . Diabetes Father   . Alzheimer's disease Father   . Glaucoma    . Macular degeneration      History   Social History  . Marital Status: Married    Spouse Name: N/A    Number of Children: N/A  . Years of Education: N/A   Social History Main Topics  . Smoking status: Never Smoker   . Smokeless tobacco: None  . Alcohol Use: Yes  . Drug Use: None  . Sexually Active: None   Other Topics Concern  . None   Social History Narrative   Household of 2 married   PET your T2    BS in math  [employer]  born [place]   In [place]  [place] in 1997   Self-employed [employer] Consultant   Gravida 2 para 2 [phone]   Negative TAD seatbelts at a smoke alarm.   Last Pap 6 2007 last period 3 2000 and 5T depth for 5 2013 no recent mammogram no colonoscopy    Outpatient Encounter Prescriptions as of 01/03/2013  Medication Sig Dispense Refill  . amoxicillin (AMOXIL) 500 MG capsule Take 500 mg by mouth. For dental procedures      . fexofenadine (ALLEGRA) 180 MG tablet Take 180 mg by mouth daily.       No facility-administered encounter medications on file as of 01/03/2013.    EXAM:  BP 138/84  Pulse 78  Temp(Src) 98 F (36.7 C) (Oral)  Wt 188 lb (85.276 kg)  BMI 33.56 kg/m2  SpO2 98%  Body mass index is 33.56 kg/(m^2).  GENERAL: vitals reviewed and listed above, alert, oriented,  appears well hydrated and in no acute distress  HEENT: atraumatic, conjunctiva  clear, no obvious abnormalities on inspection of external nose and ears NECK: no obvious masses on inspection palpation  LUNGS: clear to auscultation bilaterally, no wheezes, rales or rhonchi, good air movement CV: HRRR, no clubbing cyanosis or  peripheral edema nl cap refill  MS: moves all extremities without noticeable focal  abnormality PSYCH: pleasant and cooperative, no obvious depression or anxiety Info reviewed  Lab Results  Component Value Date   WBC 7.9 05/10/2012   HGB 15.1* 05/10/2012   HCT 45.2 05/10/2012   PLT 270.0 05/10/2012   GLUCOSE 92 05/10/2012   CHOL 190 05/10/2012   TRIG 122.0 05/10/2012   HDL 47.50 05/10/2012   LDLCALC 118* 05/10/2012   ALT 35 07/07/2012   AST 34 07/07/2012   NA 137 05/10/2012   K 4.3 05/10/2012   CL 102 05/10/2012   CREATININE 0.7 05/10/2012   BUN 8 05/10/2012   CO2 26 05/10/2012   TSH 1.43 05/10/2012    ASSESSMENT AND PLAN:  Discussed the following assessment and plan:  Hepatitis C infection, 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   Gallstones - incidental finding on [place] for dep c surveillance  get 3# twin rix as planned . -Patient advised to return or notify health care team  if symptoms worsen or persist or new concerns arise.  Patient Instructions  Can do urology referral. Someone will contact you about this. In case not helping.  Continue blood pressure  Monitoring   For reasons discussed .  Repeat Bp   Today was 138/84  .  preventive visit in 6 months or as needed        Neta Mends. Dasja Brase M.D.

## 2013-01-11 ENCOUNTER — Encounter: Payer: Self-pay | Admitting: Internal Medicine

## 2013-01-12 ENCOUNTER — Other Ambulatory Visit: Payer: Self-pay | Admitting: Urology

## 2013-01-17 ENCOUNTER — Encounter (HOSPITAL_COMMUNITY): Payer: Self-pay | Admitting: Pharmacy Technician

## 2013-01-26 ENCOUNTER — Encounter (HOSPITAL_COMMUNITY): Payer: Self-pay | Admitting: *Deleted

## 2013-01-26 NOTE — Progress Notes (Signed)
Spoke with Theodoro Parma RN with [employer] and she said patient did not need to have an EKG prior to ESWL. She had open heart surgery in 1974 for Atrial Septal Defect and no problems since. 2 D Echo report on chart which was done 05/21/12 for Dr. Berniece Andreas. Patient does not have a cardiologist. She has white coat syndrome hypertension and takes no medication for hypertension.

## 2013-01-30 NOTE — H&P (Signed)
Chief Complaint  Right renal calculus   History of Present Illness  [name] is a 57 year old female patient seen at the request of Dr. Berniece Andreas.  She has had intermittent right-sided flank pain, gross hematuria, and abdominal pain over the past few months.  She apparently was incidentally found to have mild elevation of her LFTs and underwent further evaluation of this problem and was eventually diagnosed with hepatitis C which is felt to have been related to a prior blood transfusion from repair of an atrial septal defect in 1974.  As part of her evaluation of her hepatitis, she did undergo a right upper quadrant ultrasound in March 2014 which did incidentally note a 5 mm renal calculus without hydronephrosis.  Her right-sided flank pain symptoms first appeared approximately 6-8 weeks ago.  Her pain was described as severe with radiation to her right upper quadrant.  She also had associated gross hematuria and nausea.  She has denied any fever.  She has had multiple episodes of pain since then although has refrained from presenting to the emergency department for evaluation.  She has not undergone CT imaging.  She has a history of passing a stone living in [place] in 1990.  She also has a family history of urolithiasis with her mother having had multiple kidney stones.  She currently is asymptomatic today.     Past Medical History Problems  1. History of  Atrial Septal Defect 745.5 2. History of  Cholelithiasis 574.20 3. History of  Hepatitis, C Virus 070.70 4. History of  Hypertension 401.9 5. History of  Migraine Headache 346.90  Surgical History Problems  1. History of  Repair Of Congenital Atrial Septal Defect  Current Meds 1. Allegra Allergy 180 MG Oral Tablet; Therapy: (Recorded:28Jul2014) to 2. Amoxicillin 500 MG Oral Capsule; Therapy: (Recorded:28Jul2014) to 3. Aspirin 325 MG Oral Tablet; Therapy: (Recorded:28Jul2014) to  Allergies Medication  1. Fluvirin INJ 2.  Septra TABS 3. Sulfa Drugs  Family History Problems  1. Family history of  Alzheimer's Disease 2. Family history of  Arthritis V17.7 3. Family history of  Diabetes Mellitus V18.0 4. Family history of  Glaucoma V19.11 5. Family history of  Heart Disease V17.49 6. Family history of  Hyperlipidemia 7. Family history of  Hypertension V17.49 8. Family history of  Macular Degeneration 9. Family history of  Renal Disease 10. Family history of  Stroke Syndrome V17.1  Social History Problems    Marital History - Currently Married   Never A Smoker Denied    History of  Alcohol Use  Review of Systems Constitutional, skin, eye, otolaryngeal, hematologic/lymphatic, cardiovascular, pulmonary, endocrine, musculoskeletal, gastrointestinal, neurological and psychiatric system(s) were reviewed and pertinent findings if present are noted.  Constitutional: night sweats and feeling tired (fatigue).  ENT: sinus problems.  Hematologic/Lymphatic: a tendency to easily bruise.    Vitals Vital Signs [Data Includes: Last 1 Day]  28Jul2014 09:36AM  BMI Calculated: 33.31 BSA Calculated: 1.88 Height: 5 ft 3 in Weight: 188 lb  Blood Pressure: 171 / 95 Heart Rate: 73  Physical Exam Constitutional: Well nourished and well developed . No acute distress.  ENT:. The ears and nose are normal in appearance.  Neck: The appearance of the neck is normal and no neck mass is present.  Pulmonary: No respiratory distress and normal respiratory rhythm and effort.  Cardiovascular: Heart rate and rhythm are normal . No peripheral edema.  Abdomen: The abdomen is soft and nontender. No masses are palpated. No CVA tenderness. No hernias  are palpable. No hepatosplenomegaly noted.  Lymphatics: The femoral and inguinal nodes are not enlarged or tender.  Skin: Normal skin turgor, no visible rash and no visible skin lesions.  Neuro/Psych:. Mood and affect are appropriate.    Results/Data Urine [Data Includes: Last 1  Day]   28Jul2014  COLOR YELLOW   APPEARANCE CLEAR   SPECIFIC GRAVITY 1.020   pH 6.0   GLUCOSE NEG mg/dL  BILIRUBIN NEG   KETONE NEG mg/dL  BLOOD MOD   PROTEIN NEG mg/dL  UROBILINOGEN 1 mg/dL  NITRITE NEG   LEUKOCYTE ESTERASE NEG   SQUAMOUS EPITHELIAL/HPF RARE   WBC 3-6 WBC/hpf  RBC 3-6 RBC/hpf  BACTERIA FEW   CRYSTALS NONE SEEN   CASTS NONE SEEN   Other MUCUS NOTED   Selected Results  AU CT-STONE PROTOCOL 28Jul2014 12:00AM Dana Bailey   Test Name Result Flag Reference  ** RADIOLOGY REPORT BY [facility] **   *RADIOLOGY REPORT*  Clinical Data: Microscopic hematuria  CT ABDOMEN AND PELVIS WITHOUT CONTRAST (URINARY CALCULUS PROTOCOL)  Technique: Multidetector CT imaging was performed through the abdomen and pelvis without intravenous contrast to include the urinary tract.  Comparison: Right upper quadrant abdominal ultrasound 08/31/2012  Findings: Gallstones are re-identified. A horseshoe kidney is noted. Nonobstructing right renal moiety pelvic calculus measures 1 cm image 18. Partial fatty infiltration of the pancreas. Abdominal viscera otherwise unremarkable in their visualized aspects. No hydroureteronephrosis. No radiopaque ureteral or bladder calculus.  Uterus and ovaries are normal. Bladder is normal. Bowel is unremarkable. The appendix is normal.  Mild rightward curvature of the thoracolumbar spine is noted centered at L1. No acute osseous abnormality. No free air  IMPRESSION: Nonobstructing 1 cm right renal moiety pelvic calculus in the setting of a horseshoe kidney. No acute intra-abdominal or pelvic pathology.   Original Report Authenticated By: Christiana Pellant, M.D.     Urine culture has been sent.  I independently reviewed her CT scan.  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  She appears to have a nonobstructing 10 mm right renal calculus without hydronephrosis.  Hounsfield units measure 640.  No other calculi are identified.   Assessment Assessed  1. 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 UA With REFLEX  Done: 28Jul2014 09:12AM Nephrolithiasis (592.0)  2. OxyCODONE HCl 5 MG Oral Capsule; Take 1-2 tablets po every 6 hours prn; Therapy: 28Jul2014  to (Complete:07Aug2014); Last Rx:28Jul2014 3. Tamsulosin HCl 0.4 MG Oral Capsule; TAKE 1 CAPSULE Daily; Therapy: 28Jul2014 to  (Evaluate:11Aug2014)  Requested for: 28Jul2014; Last Rx:28Jul2014; Edited 4. URINE CULTURE  Done: 28Jul2014 5. Follow-up Office  Follow-up  Done: 28Jul2014  Discussion/Summary  1.  Right renal calculus: We reviewed her CT scan today.  Considering that she has been intermittently symptomatic, I did recommend treatment of her stone.  We also discussed her finding of a horseshoe kidney which she was aware of.  We discussed options for treatment of her stone including percutaneous nephrolithotomy, ureteroscopic laser lithotripsy, or shockwave lithotripsy.  Considering the size and density of her stone as well as its location, she would appear to be an appropriate candidate for each therapy although would not appear to require percutaneous treatment based on the size of her stone.  We therefore mostly discussed ureteroscopic therapy versus shockwave lithotripsy and the pros and cons of each approach.  After our discussion, she is most interested in proceeding with shock wave lithotripsy as initial therapy.  We discussed the percentage chance of successful treatment,  the potential risks/complications, expected recovery process, and alternative options and possible need for further therapy.  She gives her informed consent to proceed.  She was also provided a prescription for oxycodone to take as needed for pain as well as to have after her shockwave lithotripsy.  She specifically was given a prescription for pain medication without Tylenol considering her history of hepatitis C.  In addition, she was provided a  prescription for tamsulosin to begin taking after her lithotripsy.  She understands it with a horseshoe kidney, she may have a slightly lower chance of spontaneous passage of her stones.  Furthermore, her most recent liver function tests did appear to be within normal limits indicating normal hepatic function despite her diagnosis of hepatitis C.  2.  Horseshoe kidney: We discussed the implications of having a horseshoe kidney today.  She was aware of this diagnosis prior to today.  3.  Gross hematuria: This almost certainly is related to her stone.  We will reevaluate her following treatment of her stone and if she has persistent hematuria, we will discuss proceeding with a full evaluation at that time.  Cc: Dr. Berniece Andreas   Verified Results URINE CULTURE1 28Jul2014 10:14AM1 Lyda Perone  SOURCE : CLEAN CATCH SPECIMEN TYPE: CLEAN CATCH   Test Name Result Flag Reference  CULTURE, URINE1 Culture, Urine1    ===== COLONY COUNT: =====  NO GROWTH   FINAL REPORT: NO GROWTH     1. Amended By: Dana Bailey; 01/11/2013 8:34 PMEST  Signatures Electronically signed by : Dana Bailey, M.D.; Jan 11 2013  8:34PM

## 2013-01-31 ENCOUNTER — Ambulatory Visit (HOSPITAL_COMMUNITY): Payer: 59

## 2013-01-31 ENCOUNTER — Encounter (HOSPITAL_COMMUNITY)
Admission: RE | Disposition: A | Discharge status: Home / Self Care | Payer: Self-pay | Source: Ambulatory Visit | Attending: Urology

## 2013-01-31 ENCOUNTER — Ambulatory Visit (INDEPENDENT_AMBULATORY_CARE_PROVIDER_SITE_OTHER): Payer: 59 | Admitting: Family Medicine

## 2013-01-31 ENCOUNTER — Ambulatory Visit (HOSPITAL_COMMUNITY)
Admission: RE | Admit: 2013-01-31 | Discharge: 2013-01-31 | Disposition: A | Discharge status: Home / Self Care | Payer: 59 | Source: Ambulatory Visit | Attending: Urology | Admitting: Urology

## 2013-01-31 ENCOUNTER — Encounter (HOSPITAL_COMMUNITY): Payer: Self-pay | Admitting: *Deleted

## 2013-01-31 DIAGNOSIS — Q638 Other specified congenital malformations of kidney: Secondary | ICD-10-CM | POA: Insufficient documentation

## 2013-01-31 DIAGNOSIS — I10 Essential (primary) hypertension: Secondary | ICD-10-CM | POA: Insufficient documentation

## 2013-01-31 DIAGNOSIS — B1920 Unspecified viral hepatitis C without hepatic coma: Secondary | ICD-10-CM | POA: Insufficient documentation

## 2013-01-31 DIAGNOSIS — N200 Calculus of kidney: Secondary | ICD-10-CM | POA: Insufficient documentation

## 2013-01-31 DIAGNOSIS — Z23 Encounter for immunization: Secondary | ICD-10-CM

## 2013-01-31 HISTORY — DX: Inflammatory liver disease, unspecified: K75.9

## 2013-01-31 SURGERY — LITHOTRIPSY, ESWL
Anesthesia: LOCAL | Laterality: Right

## 2013-01-31 MED ORDER — DIPHENHYDRAMINE HCL 25 MG PO CAPS
25.0000 mg | ORAL_CAPSULE | ORAL | Status: AC
  Administered 2013-01-31: 25 mg via ORAL
  Filled 2013-01-31: qty 1

## 2013-01-31 MED ORDER — DIAZEPAM 5 MG PO TABS
10.0000 mg | ORAL_TABLET | ORAL | Status: AC
  Administered 2013-01-31: 10 mg via ORAL
  Filled 2013-01-31: qty 2

## 2013-01-31 MED ORDER — CIPROFLOXACIN HCL 500 MG PO TABS
500.0000 mg | ORAL_TABLET | ORAL | Status: AC
  Administered 2013-01-31: 500 mg via ORAL
  Filled 2013-01-31: qty 1

## 2013-01-31 MED ORDER — DEXTROSE-NACL 5-0.45 % IV SOLN
INTRAVENOUS | Status: DC
  Administered 2013-01-31: 11:00:00 via INTRAVENOUS

## 2013-01-31 NOTE — Interval H&P Note (Signed)
History and Physical Interval Note:  01/31/2013 12:51 PM  Dana Bailey  has presented today for surgery, with the diagnosis of RIGHT RENAL CALCULUS  The various methods of treatment have been discussed with the patient and family. After consideration of risks, benefits and other options for treatment, the patient has consented to  Procedure(s): RIGHT EXTRACORPOREAL SHOCK WAVE LITHOTRIPSY (ESWL) (Right) as a surgical intervention .  The patient's history has been reviewed, patient examined, no change in status, stable for surgery.  I have reviewed the patient's chart and labs.  Questions were answered to the patient's satisfaction.     [name],LES

## 2013-01-31 NOTE — Op Note (Signed)
Please see paper chart for [facility] operative note.

## 2013-04-21 ENCOUNTER — Other Ambulatory Visit: Payer: Self-pay

## 2013-06-28 ENCOUNTER — Other Ambulatory Visit: Payer: Self-pay

## 2013-06-28 DIAGNOSIS — Z1231 Encounter for screening mammogram for malignant neoplasm of breast: Secondary | ICD-10-CM

## 2013-07-18 ENCOUNTER — Other Ambulatory Visit: Payer: 59

## 2013-07-20 ENCOUNTER — Ambulatory Visit: Payer: 59

## 2013-07-25 ENCOUNTER — Encounter: Payer: 59 | Admitting: Internal Medicine

## 2013-07-29 ENCOUNTER — Other Ambulatory Visit: Payer: Self-pay | Admitting: Internal Medicine

## 2013-07-29 DIAGNOSIS — C220 Liver cell carcinoma: Secondary | ICD-10-CM

## 2013-08-11 ENCOUNTER — Encounter: Payer: Self-pay | Admitting: Internal Medicine

## 2013-08-23 ENCOUNTER — Ambulatory Visit
Admission: RE | Admit: 2013-08-23 | Discharge: 2013-08-23 | Disposition: A | Discharge status: Home / Self Care | Payer: 59 | Source: Ambulatory Visit | Attending: Internal Medicine | Admitting: Internal Medicine

## 2013-08-23 DIAGNOSIS — C220 Liver cell carcinoma: Secondary | ICD-10-CM

## 2013-11-28 ENCOUNTER — Ambulatory Visit: Payer: 59

## 2013-12-12 ENCOUNTER — Other Ambulatory Visit (INDEPENDENT_AMBULATORY_CARE_PROVIDER_SITE_OTHER): Payer: 59

## 2013-12-12 DIAGNOSIS — Z0000 Encounter for general adult medical examination without abnormal findings: Secondary | ICD-10-CM

## 2013-12-12 LAB — CBC WITH DIFFERENTIAL/PLATELET
BASOS ABS: 0 10*3/uL (ref 0.0–0.1)
Basophils Relative: 0.8 % (ref 0.0–3.0)
EOS PCT: 2.5 % (ref 0.0–5.0)
Eosinophils Absolute: 0.2 10*3/uL (ref 0.0–0.7)
HEMATOCRIT: 44.3 % (ref 36.0–46.0)
HEMOGLOBIN: 14.8 g/dL (ref 12.0–15.0)
LYMPHS ABS: 2.3 10*3/uL (ref 0.7–4.0)
Lymphocytes Relative: 37 % (ref 12.0–46.0)
MCHC: 33.3 g/dL (ref 30.0–36.0)
MCV: 83.4 fl (ref 78.0–100.0)
MONO ABS: 0.4 10*3/uL (ref 0.1–1.0)
Monocytes Relative: 5.8 % (ref 3.0–12.0)
NEUTROS ABS: 3.3 10*3/uL (ref 1.4–7.7)
Neutrophils Relative %: 53.9 % (ref 43.0–77.0)
PLATELETS: 247 10*3/uL (ref 150.0–400.0)
RBC: 5.32 Mil/uL — ABNORMAL HIGH (ref 3.87–5.11)
RDW: 12.6 % (ref 11.5–15.5)
WBC: 6.2 10*3/uL (ref 4.0–10.5)

## 2013-12-12 LAB — LIPID PANEL
CHOLESTEROL: 208 mg/dL — AB (ref 0–200)
HDL: 47.3 mg/dL (ref >39.00)
LDL CALC: 143 mg/dL — AB (ref 0–99)
NONHDL: 160.7
Total CHOL/HDL Ratio: 4
Triglycerides: 89 mg/dL (ref 0.0–149.0)
VLDL: 17.8 mg/dL (ref 0.0–40.0)

## 2013-12-12 LAB — HEPATIC FUNCTION PANEL
ALBUMIN: 4.6 g/dL (ref 3.5–5.2)
ALT: 17 U/L (ref 0–35)
AST: 19 U/L (ref 0–37)
Alkaline Phosphatase: 56 U/L (ref 39–117)
BILIRUBIN TOTAL: 0.4 mg/dL (ref 0.2–1.2)
Bilirubin, Direct: 0.1 mg/dL (ref 0.0–0.3)
Total Protein: 7.5 g/dL (ref 6.0–8.3)

## 2013-12-12 LAB — BASIC METABOLIC PANEL
BUN: 14 mg/dL (ref 6–23)
CHLORIDE: 105 meq/L (ref 96–112)
CO2: 27 meq/L (ref 19–32)
Calcium: 9.3 mg/dL (ref 8.4–10.5)
Creatinine, Ser: 0.8 mg/dL (ref 0.4–1.2)
GFR: 81.79 mL/min (ref >60.00)
GLUCOSE: 95 mg/dL (ref 70–99)
POTASSIUM: 4.1 meq/L (ref 3.5–5.1)
SODIUM: 139 meq/L (ref 135–145)

## 2013-12-12 LAB — TSH: TSH: 1.73 u[IU]/mL (ref 0.35–4.50)

## 2013-12-19 ENCOUNTER — Encounter: Payer: Self-pay | Admitting: Internal Medicine

## 2013-12-19 ENCOUNTER — Ambulatory Visit (INDEPENDENT_AMBULATORY_CARE_PROVIDER_SITE_OTHER): Payer: 59 | Admitting: Internal Medicine

## 2013-12-19 VITALS — BP 144/90 | HR 78 | Temp 98.1°F | Ht 63.0 in | Wt 189.0 lb

## 2013-12-19 DIAGNOSIS — Z1211 Encounter for screening for malignant neoplasm of colon: Secondary | ICD-10-CM

## 2013-12-19 DIAGNOSIS — Z0000 Encounter for general adult medical examination without abnormal findings: Secondary | ICD-10-CM

## 2013-12-19 NOTE — Progress Notes (Signed)
Pre visit review using our clinic review tool, if applicable. No additional management support is needed unless otherwise documented below in the visit note.  Chief Complaint  Patient presents with  . Annual Exam    HPI: Patient comes in today for Preventive Health Care visit  Since last visit she has had rx for HEP C and appears to be better although had index scor cw some fibrosis rx viekira  And ribaviran now ongoing [place] surveillance in fall [place] . hcv  rnano undetectable had a rash now gone.  Otherwise   No major change in health status since last visit . LIFESTYLE:  Exercise:  Walking  Tobacco/ETS: no Alcohol: per day  No  Sugar beverages:no Sleep: 6 hours per night  Drug use: no Colonoscopy: never had  Will do next year    Health Maintenance  Topic Date Due  . Colonoscopy  10/21/2005  . Influenza Vaccine  01/14/2014  . Mammogram  07/02/2014  . Pap Smear  07/08/2015  . Tetanus/tdap  09/18/2021   Health Maintenance Review   ROS:  GEN/ HEENT: No fever, significant weight changes sweats headaches vision problems hearing changes, CV/ PULM; No chest pain shortness of breath cough, syncope,edema  change in exercise tolerance. GI /GU: No adominal pain, vomiting, change in bowel habits. No blood in the stool. No significant GU symptoms. SKIN/HEME: ,no acute skin rashes suspicious lesions or bleeding. No lymphadenopathy, nodules, masses.  NEURO/ PSYCH:  No neurologic signs such as weakness numbness. No depression anxiety. IMM/ Allergy: No unusual infections.  Allergy .   REST of 12 system review negative except as per HPI   Past Medical History  Diagnosis Date  . Chicken pox   . Shingles 12/2011  . Hay fever   . Congenital heart defect   . Kidney stones 1761,6073  . Hx of heart surgery     asd closure  . Hx of echocardiogram 2013    nl except 2 diastolic dysfunction nl lv   no pht,no asd  . Transfusion history 1974    4 units  1 from father  heart surgery  . Need for  rhogam due to Rh negative mother     x 2 1982 and 1986  . High blood pressure     white coat syndrome  . Hepatitis      hepatitis C from blood transfusion    Family History  Problem Relation Age of Onset  . Arthritis Mother     Degenerative disc disease  . Hyperlipidemia Mother   . Hypertension Mother   . Kidney disease Mother   . Mental illness Mother   . Arthritis Father   . Hyperlipidemia Father   . Heart disease Father   . Stroke Father   . Hypertension Father   . Diabetes Father   . Alzheimer's disease Father   . Glaucoma    . Macular degeneration      History   Social History  . Marital Status: Married    Spouse Name: N/A    Number of Children: N/A  . Years of Education: N/A   Social History Main Topics  . Smoking status: Never Smoker   . Smokeless tobacco: None  . Alcohol Use: Yes     Comment: rarely  . Drug Use: No  . Sexual Activity: None   Other Topics Concern  . None   Social History Narrative   Household of 2 married   PET your T2    BS in math  [employer]  born [place]   In [place] in [place] 2 para 2 [phone]   Negative TAD seatbelts at a smoke alarm.   Last Pap 6 2007 last period 3 2000 and 5T depth for 5 2013 no recent mammogram no colonoscopy    Outpatient Encounter Prescriptions as of 12/19/2013  Medication Sig  . fexofenadine (ALLEGRA) 180 MG tablet Take 180 mg by mouth daily.    EXAM:  BP 144/90  Pulse 78  Temp(Src) 98.1 F (36.7 C) (Oral)  Ht 5\' 3"  (1.6 m)  Wt 189 lb (85.73 kg)  BMI 33.49 kg/m2  SpO2 97%  Body mass index is 33.49 kg/(m^2).  Physical Exam: Vital signs reviewed XBM:WUXL is a well-developed well-nourished alert cooperative    who appearsr stated age in no acute distress.  HEENT: normocephalic atraumatic , Eyes: PERRL EOM's full, conjunctiva clear, Nares: paten,t no deformity discharge or tenderness., Ears: no deformity EAC's clear TMs with normal  landmarks. Mouth: clear OP, no lesions, edema.  Moist mucous membranes. Dentition in adequate repair. NECK: supple without masses, thyromegaly or bruits. CHEST/PULM:  Clear to auscultation and percussion breath sounds equal no wheeze , rales or rhonchi. No chest wall deformities or tenderness. CV: PMI is nondisplaced, S1 S2 no gallops, murmurs, rubs. Peripheral pulses are full without delay.No JVD .  Breast: normal by inspection . No dimpling, discharge, masses, tenderness or discharge . ABDOMEN: Bowel sounds normal nontender  No guard or rebound, no hepato splenomegal no CVA tenderness.  No hernia. Extremtities:  No clubbing cyanosis or edema, no acute joint swelling or redness no focal atrophy NEURO:  Oriented x3, cranial nerves 3-12 appear to be intact, no obvious focal weakness,gait within normal limits no abnormal reflexes or asymmetrical SKIN: No acute rashes normal turgor, color, no bruising or petechiae. PSYCH: Oriented, good eye contact, no obvious depression anxiety, cognition and judgment appear normal. LN: no cervical axillary inguinal adenopathy  Lab Results  Component Value Date   WBC 6.2 12/12/2013   HGB 14.8 12/12/2013   HCT 44.3 12/12/2013   PLT 247.0 12/12/2013   GLUCOSE 95 12/12/2013   CHOL 208* 12/12/2013   TRIG 89.0 12/12/2013   HDL 47.30 12/12/2013   LDLCALC 143* 12/12/2013   ALT 17 12/12/2013   AST 19 12/12/2013   NA 139 12/12/2013   K 4.1 12/12/2013   CL 105 12/12/2013   CREATININE 0.8 12/12/2013   BUN 14 12/12/2013   CO2 27 12/12/2013   TSH 1.73 12/12/2013    ASSESSMENT AND PLAN:  Discussed the following assessment and plan:  Visit for preventive health examination - disc colon screen ifob and colon when can   Colon cancer screening - Plan: Fecal occult blood, imunochemical Hep c felt resolved cured rx . Counseled regarding healthy nutrition, exercise, sleep, injury prevention, calcium vit d and healthy weight .  Patient Care Team: Burnis Medin, MD as PCP -  General (Internal Medicine) Hadassah Pais as Consulting Physician (Internal Medicine) Patient Instructions  Continue lifestyle intervention healthy eating and exercise . Check BP readings to ensure is below 140/90 or less.  150 minutes of exercise weeks  ,  Lose weight  To healthy levels. Avoid trans fats and processed foods;  Increase fresh fruits and veges to 5 servings per day. And avoid sweet beverages  Including tea and juice.  Do stool screening tests  For colon cancer until you can get the colonoscopy.    Standley Brooking.  Panosh M.D.

## 2013-12-19 NOTE — Patient Instructions (Addendum)
Continue lifestyle intervention healthy eating and exercise . Check BP readings to ensure is below 140/90 or less.  150 minutes of exercise weeks  ,  Lose weight  To healthy levels. Avoid trans fats and processed foods;  Increase fresh fruits and veges to 5 servings per day. And avoid sweet beverages  Including tea and juice.  Do stool screening tests  For colon cancer until you can get the colonoscopy.

## 2014-04-17 ENCOUNTER — Encounter: Payer: Self-pay | Admitting: Internal Medicine

## 2014-07-05 ENCOUNTER — Other Ambulatory Visit (HOSPITAL_COMMUNITY): Payer: Self-pay | Admitting: Nurse Practitioner

## 2014-07-05 DIAGNOSIS — B1920 Unspecified viral hepatitis C without hepatic coma: Secondary | ICD-10-CM

## 2014-08-10 ENCOUNTER — Ambulatory Visit (HOSPITAL_COMMUNITY)
Admission: RE | Admit: 2014-08-10 | Discharge: 2014-08-10 | Disposition: A | Discharge status: Home / Self Care | Payer: 59 | Source: Ambulatory Visit | Attending: Nurse Practitioner | Admitting: Nurse Practitioner

## 2014-08-10 DIAGNOSIS — Q631 Lobulated, fused and horseshoe kidney: Secondary | ICD-10-CM | POA: Diagnosis not present

## 2014-08-10 DIAGNOSIS — K8020 Calculus of gallbladder without cholecystitis without obstruction: Secondary | ICD-10-CM | POA: Diagnosis not present

## 2014-08-10 DIAGNOSIS — B1920 Unspecified viral hepatitis C without hepatic coma: Secondary | ICD-10-CM | POA: Diagnosis present

## 2014-08-31 ENCOUNTER — Other Ambulatory Visit: Payer: Self-pay

## 2014-08-31 DIAGNOSIS — Z1231 Encounter for screening mammogram for malignant neoplasm of breast: Secondary | ICD-10-CM

## 2014-09-06 ENCOUNTER — Ambulatory Visit
Admission: RE | Admit: 2014-09-06 | Discharge: 2014-09-06 | Disposition: A | Discharge status: Home / Self Care | Payer: 59 | Source: Ambulatory Visit

## 2014-09-06 DIAGNOSIS — Z1231 Encounter for screening mammogram for malignant neoplasm of breast: Secondary | ICD-10-CM

## 2014-12-13 ENCOUNTER — Other Ambulatory Visit: Payer: 59

## 2014-12-20 ENCOUNTER — Encounter: Payer: 59 | Admitting: Internal Medicine

## 2015-05-15 ENCOUNTER — Other Ambulatory Visit (INDEPENDENT_AMBULATORY_CARE_PROVIDER_SITE_OTHER): Payer: 59

## 2015-05-15 DIAGNOSIS — Z0000 Encounter for general adult medical examination without abnormal findings: Secondary | ICD-10-CM

## 2015-05-15 LAB — LIPID PANEL
CHOLESTEROL: 198 mg/dL (ref 0–200)
HDL: 43.4 mg/dL (ref >39.00)
LDL Cholesterol: 133 mg/dL — ABNORMAL HIGH (ref 0–99)
NonHDL: 154.69
Total CHOL/HDL Ratio: 5
Triglycerides: 110 mg/dL (ref 0.0–149.0)
VLDL: 22 mg/dL (ref 0.0–40.0)

## 2015-05-15 LAB — CBC WITH DIFFERENTIAL/PLATELET
BASOS PCT: 0.8 % (ref 0.0–3.0)
Basophils Absolute: 0 10*3/uL (ref 0.0–0.1)
EOS PCT: 4.3 % (ref 0.0–5.0)
Eosinophils Absolute: 0.2 10*3/uL (ref 0.0–0.7)
HCT: 43.2 % (ref 36.0–46.0)
Hemoglobin: 14.4 g/dL (ref 12.0–15.0)
LYMPHS ABS: 2.1 10*3/uL (ref 0.7–4.0)
Lymphocytes Relative: 40.9 % (ref 12.0–46.0)
MCHC: 33.4 g/dL (ref 30.0–36.0)
MCV: 83.7 fl (ref 78.0–100.0)
MONO ABS: 0.3 10*3/uL (ref 0.1–1.0)
Monocytes Relative: 5 % (ref 3.0–12.0)
NEUTROS ABS: 2.5 10*3/uL (ref 1.4–7.7)
NEUTROS PCT: 49 % (ref 43.0–77.0)
PLATELETS: 258 10*3/uL (ref 150.0–400.0)
RBC: 5.16 Mil/uL — ABNORMAL HIGH (ref 3.87–5.11)
RDW: 13.1 % (ref 11.5–15.5)
WBC: 5.1 10*3/uL (ref 4.0–10.5)

## 2015-05-15 LAB — TSH: TSH: 1.99 u[IU]/mL (ref 0.35–4.50)

## 2015-05-15 LAB — BASIC METABOLIC PANEL
BUN: 17 mg/dL (ref 6–23)
CHLORIDE: 105 meq/L (ref 96–112)
CO2: 26 mEq/L (ref 19–32)
Calcium: 9.5 mg/dL (ref 8.4–10.5)
Creatinine, Ser: 0.72 mg/dL (ref 0.40–1.20)
GFR: 87.95 mL/min (ref >60.00)
GLUCOSE: 83 mg/dL (ref 70–99)
POTASSIUM: 4.1 meq/L (ref 3.5–5.1)
Sodium: 141 mEq/L (ref 135–145)

## 2015-05-15 LAB — HEPATIC FUNCTION PANEL
ALT: 12 U/L (ref 0–35)
AST: 14 U/L (ref 0–37)
Albumin: 4.5 g/dL (ref 3.5–5.2)
Alkaline Phosphatase: 48 U/L (ref 39–117)
BILIRUBIN TOTAL: 0.6 mg/dL (ref 0.2–1.2)
Bilirubin, Direct: 0.1 mg/dL (ref 0.0–0.3)
Total Protein: 6.6 g/dL (ref 6.0–8.3)

## 2015-05-16 ENCOUNTER — Other Ambulatory Visit: Payer: 59

## 2015-05-17 ENCOUNTER — Other Ambulatory Visit: Payer: 59

## 2015-05-18 ENCOUNTER — Other Ambulatory Visit: Payer: 59

## 2015-05-22 ENCOUNTER — Encounter: Payer: Self-pay | Admitting: Internal Medicine

## 2015-05-22 ENCOUNTER — Ambulatory Visit (INDEPENDENT_AMBULATORY_CARE_PROVIDER_SITE_OTHER): Payer: 59 | Admitting: Internal Medicine

## 2015-05-22 VITALS — BP 130/80 | Temp 98.4°F | Ht 62.5 in | Wt 182.7 lb

## 2015-05-22 DIAGNOSIS — Z0000 Encounter for general adult medical examination without abnormal findings: Secondary | ICD-10-CM

## 2015-05-22 NOTE — Progress Notes (Signed)
Pre visit review using our clinic review tool, if applicable. No additional management support is needed unless otherwise documented below in the visit note.  Chief Complaint  Patient presents with  . Annual Exam    HPI: Patient  Dana Bailey  59 y.o. comes in today for [facility] visit  czratking mom falling fx    s now in assisted living  Off all hep c meds  fibor score 0-1 and nl lfts  Appears cured  No osa feels fine   Health Maintenance  Topic Date Due  . COLONOSCOPY  10/21/2005  . INFLUENZA VACCINE  11/14/2015 (Originally 01/15/2015)  . PAP SMEAR  07/08/2015  . MAMMOGRAM  09/05/2016  . TETANUS/TDAP  09/18/2021  . Hepatitis C Screening  Completed  . HIV Screening  Completed   Health Maintenance Review LIFESTYLE:  Exercise:   Walks daily  Or other  .  Tobacco/ETS: no Alcohol: no Sugar beverages: water  Sleep: wakening  X 2 no osa  Reported .  Drug use: no  ROS:  GEN/ HEENT: No fever, significant weight changes sweats headaches vision problems hearing changes, CV/ PULM; No chest pain shortness of breath cough, syncope,edema  change in exercise tolerance. GI /GU: No adominal pain, vomiting, change in bowel habits. No blood in the stool. No significant GU symptoms. SKIN/HEME: ,no acute skin rashes suspicious lesions or bleeding. No lymphadenopathy, nodules, masses.  NEURO/ PSYCH:  No neurologic signs such as weakness numbness. No depression anxiety. IMM/ Allergy: No unusual infections.  Allergy .   REST of 12 system review negative except as per HPI   Past Medical History  Diagnosis Date  . Chicken pox   . Shingles 12/2011  . Hay fever   . Congenital heart defect   . Kidney stones [id number]  . Hx of heart surgery     asd closure  . Hx of echocardiogram 2013    nl except 2 diastolic dysfunction nl lv   no pht,no asd  . Transfusion history 1974    4 units  1 from father  heart surgery  . Need for rhogam due to Rh negative mother     x 2 1982 and 1986    . High blood pressure     white coat syndrome  . Hepatitis      hepatitis C from blood transfusion  . Hepatitis C infection 08/02/2012    most likely from transfusion in 1970s  Type 1B in hep c clinic  Dr Virgina Jock [place] neg for hepatic changes rx viekira and ribavirin 12 weeks  Neg hcv rna in MAY 15   Surveillance [place] in fall had adbanced fibrosis on Fibrascan  No sx     Past Surgical History  Procedure Laterality Date  . Asd repair  1974    Age 82  . Ovarian cyst removal  1998    Laparoscopic 1998  . Tonsilectomy, adenoidectomy, bilateral myringotomy and tubes  1962    Family History  Problem Relation Age of Onset  . Arthritis Mother     Degenerative disc disease  . Hyperlipidemia Mother   . Hypertension Mother   . Kidney disease Mother   . Mental illness Mother   . Arthritis Father   . Hyperlipidemia Father   . Heart disease Father   . Stroke Father   . Hypertension Father   . Diabetes Father   . Alzheimer's disease Father   . Glaucoma    . Macular degeneration  Social History   Social History  . Marital Status: Married    Spouse Name: N/A  . Number of Children: N/A  . Years of Education: N/A   Social History Main Topics  . Smoking status: Never Smoker   . Smokeless tobacco: None  . Alcohol Use: Yes     Comment: rarely  . Drug Use: No  . Sexual Activity: Not Asked   Other Topics Concern  . None   Social History Narrative   Household of 2 married   PET your T2    BS in math  [employer]  born [place]   In [place] in [place] 2 para 2 [phone]   Negative TAD seatbelts at a smoke alarm.   Last Pap 6 2007 last period 3 2000 and 5T depth for 5 2013 no recent mammogram no colonoscopy    Outpatient Prescriptions Prior to Visit  Medication Sig Dispense Refill  . fexofenadine (ALLEGRA) 180 MG tablet Take 180 mg by mouth daily.     No facility-administered medications prior to visit.      EXAM:  BP 130/80 mmHg  Temp(Src) 98.4 F (36.9 C) (Oral)  Ht 5' 2.5" (1.588 m)  Wt 182 lb 11.2 oz (82.872 kg)  BMI 32.86 kg/m2  Body mass index is 32.86 kg/(m^2).  Physical Exam: Vital signs reviewed JEH:UDJS is a well-developed well-nourished alert cooperative    who appearsr stated age in no acute distress.  HEENT: normocephalic atraumatic , Eyes: PERRL EOM's full, conjunctiva clear, Nares: paten,t no deformity discharge or tenderness., Ears: no deformity EAC's clear TMs with normal landmarks. Mouth: clear OP, no lesions, edema.  Moist mucous membranes. Dentition in adequate repair. NECK: supple without masses, thyromegaly or bruits. CHEST/PULM:  Clear to auscultation and percussion breath sounds equal no wheeze , rales or rhonchi. No chest wall deformities or tenderness.Breast: normal by inspection . No dimpling, discharge, masses, tenderness or discharge . CV: PMI is nondisplaced, S1 S2 no gallops, murmurs, rubs. Peripheral pulses are full without delay.No JVD .  ABDOMEN: Bowel sounds normal nontender  No guard or rebound, no hepato splenomegal no CVA tenderness.  No hernia. Extremtities:  No clubbing cyanosis or edema, no acute joint swelling or redness no focal atrophy NEURO:  Oriented x3, cranial nerves 3-12 appear to be intact, no obvious focal weakness,gait within normal limits no abnormal reflexes or asymmetrical SKIN: No acute rashes normal turgor, color, no bruising or petechiae. PSYCH: Oriented, good eye contact, no obvious depression anxiety, cognition and judgment appear normal. LN: no cervical axillary inguinal adenopathy  Lab Results  Component Value Date   WBC 5.1 05/15/2015   HGB 14.4 05/15/2015   HCT 43.2 05/15/2015   PLT 258.0 05/15/2015   GLUCOSE 83 05/15/2015   CHOL 198 05/15/2015   TRIG 110.0 05/15/2015   HDL 43.40 05/15/2015   LDLCALC 133* 05/15/2015   ALT 12 05/15/2015   AST 14 05/15/2015   NA 141 05/15/2015   K 4.1 05/15/2015   CL 105  05/15/2015   CREATININE 0.72 05/15/2015   BUN 17 05/15/2015   CO2 26 05/15/2015   TSH 1.99 05/15/2015   BP Readings from Last 3 Encounters:  05/22/15 130/80  12/19/13 144/90  01/31/13 145/70   Wt Readings from Last 3 Encounters:  05/22/15 182 lb 11.2 oz (82.872 kg)  12/19/13 189 lb (85.73 kg)  01/31/13 185 lb 8 oz (84.142 kg)     ASSESSMENT AND PLAN:  Discussed  the following assessment and plan:  Visit for preventive health examination send stool screen  To alb but not in epic      Get yearly cbc  Follow uncertain sig  Nl h and h  Patient Care Team: Burnis Medin, MD as PCP - General (Internal Medicine) Hadassah Pais as Consulting Physician (Internal Medicine) Patient Instructions  Will follow  Red count .  And labs  Continue lifestyle intervention healthy eating and exercise . Make sure BP is  At goal below  140/90 .    Health Maintenance, Female Adopting a healthy lifestyle and getting preventive care can go a long way to promote health and wellness. Talk with your health care provider about what schedule of regular examinations is right for you. This is a good chance for you to check in with your provider about disease prevention and staying healthy. In between checkups, there are plenty of things you can do on your own. Experts have done a lot of research about which lifestyle changes and preventive measures are most likely to keep you healthy. Ask your health care provider for more information. WEIGHT AND DIET  Eat a healthy diet  Be sure to include plenty of vegetables, fruits, low-fat dairy products, and lean protein.  Do not eat a lot of foods high in solid fats, added sugars, or salt.  Get regular exercise. This is one of the most important things you can do for your health.  Most adults should exercise for at least 150 minutes each week. The exercise should increase your heart rate and make you sweat (moderate-intensity exercise).  Most adults should also do  strengthening exercises at least twice a week. This is in addition to the moderate-intensity exercise.  Maintain a healthy weight  Body mass index (BMI) is a measurement that can be used to identify possible weight problems. It estimates body fat based on height and weight. Your health care provider can help determine your BMI and help you achieve or maintain a healthy weight.  For females 7 years of age and older:   A BMI below 18.5 is considered underweight.  A BMI of 18.5 to 24.9 is normal.  A BMI of 25 to 29.9 is considered overweight.  A BMI of 30 and above is considered obese.  Watch levels of cholesterol and blood lipids  You should start having your blood tested for lipids and cholesterol at 59 years of age, then have this test every 5 years.  You may need to have your cholesterol levels checked more often if:  Your lipid or cholesterol levels are high.  You are older than 59 years of age.  You are at high risk for heart disease.  CANCER SCREENING   Lung Cancer  Lung cancer screening is recommended for adults 74-39 years old who are at high risk for lung cancer because of a history of smoking.  A yearly low-dose CT scan of the lungs is recommended for people who:  Currently smoke.  Have quit within the past 15 years.  Have at least a 30-pack-year history of smoking. A pack year is smoking an average of one pack of cigarettes a day for 1 year.  Yearly screening should continue until it has been 15 years since you quit.  Yearly screening should stop if you develop a health problem that would prevent you from having lung cancer treatment.  Breast Cancer  Practice breast self-awareness. This means understanding how your breasts normally appear and feel.  It  also means doing regular breast self-exams. Let your health care provider know about any changes, no matter how small.  If you are in your 20s or 30s, you should have a clinical breast exam (CBE) by a  health care provider every 1-3 years as part of a regular health exam.  If you are 4 or older, have a CBE every year. Also consider having a breast X-ray (mammogram) every year.  If you have a family history of breast cancer, talk to your health care provider about genetic screening.  If you are at high risk for breast cancer, talk to your health care provider about having an MRI and a mammogram every year.  Breast cancer gene (BRCA) assessment is recommended for women who have family members with BRCA-related cancers. BRCA-related cancers include:  Breast.  Ovarian.  Tubal.  Peritoneal cancers.  Results of the assessment will determine the need for genetic counseling and BRCA1 and BRCA2 testing. Cervical Cancer Your health care provider may recommend that you be screened regularly for cancer of the pelvic organs (ovaries, uterus, and vagina). This screening involves a pelvic examination, including checking for microscopic changes to the surface of your cervix (Pap test). You may be encouraged to have this screening done every 3 years, beginning at age 43.  For women ages 84-65, health care providers may recommend pelvic exams and Pap testing every 3 years, or they may recommend the Pap and pelvic exam, combined with testing for human papilloma virus (HPV), every 5 years. Some types of HPV increase your risk of cervical cancer. Testing for HPV may also be done on women of any age with unclear Pap test results.  Other health care providers may not recommend any screening for nonpregnant women who are considered low risk for pelvic cancer and who do not have symptoms. Ask your health care provider if a screening pelvic exam is right for you.  If you have had past treatment for cervical cancer or a condition that could lead to cancer, you need Pap tests and screening for cancer for at least 20 years after your treatment. If Pap tests have been discontinued, your risk factors (such as having a  new sexual partner) need to be reassessed to determine if screening should resume. Some women have medical problems that increase the chance of getting cervical cancer. In these cases, your health care provider may recommend more frequent screening and Pap tests. Colorectal Cancer  This type of cancer can be detected and often prevented.  Routine colorectal cancer screening usually begins at 59 years of age and continues through 59 years of age.  Your health care provider may recommend screening at an earlier age if you have risk factors for colon cancer.  Your health care provider may also recommend using home test kits to check for hidden blood in the stool.  A small camera at the end of a tube can be used to examine your colon directly (sigmoidoscopy or colonoscopy). This is done to check for the earliest forms of colorectal cancer.  Routine screening usually begins at age 74.  Direct examination of the colon should be repeated every 5-10 years through 59 years of age. However, you may need to be screened more often if early forms of precancerous polyps or small growths are found. Skin Cancer  Check your skin from head to toe regularly.  Tell your health care provider about any new moles or changes in moles, especially if there is a change in a mole's  shape or color.  Also tell your health care provider if you have a mole that is larger than the size of a pencil eraser.  Always use sunscreen. Apply sunscreen liberally and repeatedly throughout the day.  Protect yourself by wearing long sleeves, pants, a wide-brimmed hat, and sunglasses whenever you are outside. HEART DISEASE, DIABETES, AND HIGH BLOOD PRESSURE   High blood pressure causes heart disease and increases the risk of stroke. High blood pressure is more likely to develop in:  People who have blood pressure in the high end of the normal range (130-139/85-89 mm Hg).  People who are overweight or obese.  People who are  African American.  If you are 28-21 years of age, have your blood pressure checked every 3-5 years. If you are 31 years of age or older, have your blood pressure checked every year. You should have your blood pressure measured twice--once when you are at a hospital or clinic, and once when you are not at a hospital or clinic. Record the average of the two measurements. To check your blood pressure when you are not at a hospital or clinic, you can use:  An automated blood pressure machine at a pharmacy.  A home blood pressure monitor.  If you are between 29 years and 48 years old, ask your health care provider if you should take aspirin to prevent strokes.  Have regular diabetes screenings. This involves taking a blood sample to check your fasting blood sugar level.  If you are at a normal weight and have a low risk for diabetes, have this test once every three years after 59 years of age.  If you are overweight and have a high risk for diabetes, consider being tested at a younger age or more often. PREVENTING INFECTION  Hepatitis B  If you have a higher risk for hepatitis B, you should be screened for this virus. You are considered at high risk for hepatitis B if:  You were born in a country where hepatitis B is common. Ask your health care provider which countries are considered high risk.  Your parents were born in a high-risk country, and you have not been immunized against hepatitis B (hepatitis B vaccine).  You have HIV or AIDS.  You use needles to inject street drugs.  You live with someone who has hepatitis B.  You have had sex with someone who has hepatitis B.  You get hemodialysis treatment.  You take certain medicines for conditions, including cancer, organ transplantation, and autoimmune conditions. Hepatitis C  Blood testing is recommended for:  Everyone born from [birth year] through 1965.  Anyone with known risk factors for hepatitis C. Sexually transmitted infections  (STIs)  You should be screened for sexually transmitted infections (STIs) including gonorrhea and chlamydia if:  You are sexually active and are younger than 59 years of age.  You are older than 59 years of age and your health care provider tells you that you are at risk for this type of infection.  Your sexual activity has changed since you were last screened and you are at an increased risk for chlamydia or gonorrhea. Ask your health care provider if you are at risk.  If you do not have HIV, but are at risk, it may be recommended that you take a prescription medicine daily to prevent HIV infection. This is called pre-exposure prophylaxis (PrEP). You are considered at risk if:  You are sexually active and do not regularly use condoms or know the  HIV status of your partner(s).  You take drugs by injection.  You are sexually active with a partner who has HIV. Talk with your health care provider about whether you are at high risk of being infected with HIV. If you choose to begin PrEP, you should first be tested for HIV. You should then be tested every 3 months for as long as you are taking PrEP.  PREGNANCY   If you are premenopausal and you may become pregnant, ask your health care provider about preconception counseling.  If you may become pregnant, take 400 to 800 micrograms (mcg) of folic acid every day.  If you want to prevent pregnancy, talk to your health care provider about birth control (contraception). OSTEOPOROSIS AND MENOPAUSE   Osteoporosis is a disease in which the bones lose minerals and strength with aging. This can result in serious bone fractures. Your risk for osteoporosis can be identified using a bone density scan.  If you are 68 years of age or older, or if you are at risk for osteoporosis and fractures, ask your health care provider if you should be screened.  Ask your health care provider whether you should take a calcium or vitamin D supplement to lower your risk  for osteoporosis.  Menopause may have certain physical symptoms and risks.  Hormone replacement therapy may reduce some of these symptoms and risks. Talk to your health care provider about whether hormone replacement therapy is right for you.  HOME CARE INSTRUCTIONS   Schedule regular health, dental, and eye exams.  Stay current with your immunizations.   Do not use any tobacco products including cigarettes, chewing tobacco, or electronic cigarettes.  If you are pregnant, do not drink alcohol.  If you are breastfeeding, limit how much and how often you drink alcohol.  Limit alcohol intake to no more than 1 drink per day for nonpregnant women. One drink equals 12 ounces of beer, 5 ounces of wine, or 1 ounces of hard liquor.  Do not use street drugs.  Do not share needles.  Ask your health care provider for help if you need support or information about quitting drugs.  Tell your health care provider if you often feel depressed.  Tell your health care provider if you have ever been abused or do not feel safe at home.   This information is not intended to replace advice given to you by your health care provider. Make sure you discuss any questions you have with your health care provider.   Document Released: 12/16/2010 Document Revised: 06/23/2014 Document Reviewed: 05/04/2013 Elsevier Interactive Patient Education 2016 [place]. Kaeley Vinje M.D.

## 2015-05-22 NOTE — Patient Instructions (Addendum)
Will follow  Red count .  And labs  Continue lifestyle intervention healthy eating and exercise . Make sure BP is  At goal below  140/90 .    Health Maintenance, Female Adopting a healthy lifestyle and getting preventive care can go a long way to promote health and wellness. Talk with your health care provider about what schedule of regular examinations is right for you. This is a good chance for you to check in with your provider about disease prevention and staying healthy. In between checkups, there are plenty of things you can do on your own. Experts have done a lot of research about which lifestyle changes and preventive measures are most likely to keep you healthy. Ask your health care provider for more information. WEIGHT AND DIET  Eat a healthy diet  Be sure to include plenty of vegetables, fruits, low-fat dairy products, and lean protein.  Do not eat a lot of foods high in solid fats, added sugars, or salt.  Get regular exercise. This is one of the most important things you can do for your health.  Most adults should exercise for at least 150 minutes each week. The exercise should increase your heart rate and make you sweat (moderate-intensity exercise).  Most adults should also do strengthening exercises at least twice a week. This is in addition to the moderate-intensity exercise.  Maintain a healthy weight  Body mass index (BMI) is a measurement that can be used to identify possible weight problems. It estimates body fat based on height and weight. Your health care provider can help determine your BMI and help you achieve or maintain a healthy weight.  For females 36 years of age and older:   A BMI below 18.5 is considered underweight.  A BMI of 18.5 to 24.9 is normal.  A BMI of 25 to 29.9 is considered overweight.  A BMI of 30 and above is considered obese.  Watch levels of cholesterol and blood lipids  You should start having your blood tested for lipids and  cholesterol at 59 years of age, then have this test every 5 years.  You may need to have your cholesterol levels checked more often if:  Your lipid or cholesterol levels are high.  You are older than 59 years of age.  You are at high risk for heart disease.  CANCER SCREENING   Lung Cancer  Lung cancer screening is recommended for adults 61-48 years old who are at high risk for lung cancer because of a history of smoking.  A yearly low-dose CT scan of the lungs is recommended for people who:  Currently smoke.  Have quit within the past 15 years.  Have at least a 30-pack-year history of smoking. A pack year is smoking an average of one pack of cigarettes a day for 1 year.  Yearly screening should continue until it has been 15 years since you quit.  Yearly screening should stop if you develop a health problem that would prevent you from having lung cancer treatment.  Breast Cancer  Practice breast self-awareness. This means understanding how your breasts normally appear and feel.  It also means doing regular breast self-exams. Let your health care provider know about any changes, no matter how small.  If you are in your 20s or 30s, you should have a clinical breast exam (CBE) by a health care provider every 1-3 years as part of a regular health exam.  If you are 48 or older, have a CBE every  having a breast X-ray (mammogram) every year.  If you have a family history of breast cancer, talk to your health care provider about genetic screening.  If you are at high risk for breast cancer, talk to your health care provider about having an MRI and a mammogram every year.  Breast cancer gene (BRCA) assessment is recommended for women who have family members with BRCA-related cancers. BRCA-related cancers include:  Breast.  Ovarian.  Tubal.  Peritoneal cancers.  Results of the assessment will determine the need for genetic counseling and BRCA1 and BRCA2  testing. Cervical Cancer Your health care provider may recommend that you be screened regularly for cancer of the pelvic organs (ovaries, uterus, and vagina). This screening involves a pelvic examination, including checking for microscopic changes to the surface of your cervix (Pap test). You may be encouraged to have this screening done every 3 years, beginning at age 21.  For women ages 30-65, health care providers may recommend pelvic exams and Pap testing every 3 years, or they may recommend the Pap and pelvic exam, combined with testing for human papilloma virus (HPV), every 5 years. Some types of HPV increase your risk of cervical cancer. Testing for HPV may also be done on women of any age with unclear Pap test results.  Other health care providers may not recommend any screening for nonpregnant women who are considered low risk for pelvic cancer and who do not have symptoms. Ask your health care provider if a screening pelvic exam is right for you.  If you have had past treatment for cervical cancer or a condition that could lead to cancer, you need Pap tests and screening for cancer for at least 20 years after your treatment. If Pap tests have been discontinued, your risk factors (such as having a new sexual partner) need to be reassessed to determine if screening should resume. Some women have medical problems that increase the chance of getting cervical cancer. In these cases, your health care provider may recommend more frequent screening and Pap tests. Colorectal Cancer  This type of cancer can be detected and often prevented.  Routine colorectal cancer screening usually begins at 59 years of age and continues through 59 years of age.  Your health care provider may recommend screening at an earlier age if you have risk factors for colon cancer.  Your health care provider may also recommend using home test kits to check for hidden blood in the stool.  A small camera at the end of a  tube can be used to examine your colon directly (sigmoidoscopy or colonoscopy). This is done to check for the earliest forms of colorectal cancer.  Routine screening usually begins at age 50.  Direct examination of the colon should be repeated every 5-10 years through 59 years of age. However, you may need to be screened more often if early forms of precancerous polyps or small growths are found. Skin Cancer  Check your skin from head to toe regularly.  Tell your health care provider about any new moles or changes in moles, especially if there is a change in a mole's shape or color.  Also tell your health care provider if you have a mole that is larger than the size of a pencil eraser.  Always use sunscreen. Apply sunscreen liberally and repeatedly throughout the day.  Protect yourself by wearing long sleeves, pants, a wide-brimmed hat, and sunglasses whenever you are outside. HEART DISEASE, DIABETES, AND HIGH BLOOD PRESSURE   High blood   High blood pressure causes heart disease and increases the risk of stroke. High blood pressure is more likely to develop in:  People who have blood pressure in the high end of the normal range (130-139/85-89 mm Hg).  People who are overweight or obese.  People who are African American.  If you are 18-39 years of age, have your blood pressure checked every 3-5 years. If you are 40 years of age or older, have your blood pressure checked every year. You should have your blood pressure measured twice--once when you are at a hospital or clinic, and once when you are not at a hospital or clinic. Record the average of the two measurements. To check your blood pressure when you are not at a hospital or clinic, you can use:  An automated blood pressure machine at a pharmacy.  A home blood pressure monitor.  If you are between 55 years and 79 years old, ask your health care provider if you should take aspirin to prevent strokes.  Have regular diabetes screenings. This  involves taking a blood sample to check your fasting blood sugar level.  If you are at a normal weight and have a low risk for diabetes, have this test once every three years after 59 years of age.  If you are overweight and have a high risk for diabetes, consider being tested at a younger age or more often. PREVENTING INFECTION  Hepatitis B  If you have a higher risk for hepatitis B, you should be screened for this virus. You are considered at high risk for hepatitis B if:  You were born in a country where hepatitis B is common. Ask your health care provider which countries are considered high risk.  Your parents were born in a high-risk country, and you have not been immunized against hepatitis B (hepatitis B vaccine).  You have HIV or AIDS.  You use needles to inject street drugs.  You live with someone who has hepatitis B.  You have had sex with someone who has hepatitis B.  You get hemodialysis treatment.  You take certain medicines for conditions, including cancer, organ transplantation, and autoimmune conditions. Hepatitis C  Blood testing is recommended for:  Everyone born from 1945 through 1965.  Anyone with known risk factors for hepatitis C. Sexually transmitted infections (STIs)  You should be screened for sexually transmitted infections (STIs) including gonorrhea and chlamydia if:  You are sexually active and are younger than 59 years of age.  You are older than 59 years of age and your health care provider tells you that you are at risk for this type of infection.  Your sexual activity has changed since you were last screened and you are at an increased risk for chlamydia or gonorrhea. Ask your health care provider if you are at risk.  If you do not have HIV, but are at risk, it may be recommended that you take a prescription medicine daily to prevent HIV infection. This is called pre-exposure prophylaxis (PrEP). You are considered at risk if:  You are  sexually active and do not regularly use condoms or know the HIV status of your partner(s).  You take drugs by injection.  You are sexually active with a partner who has HIV. Talk with your health care provider about whether you are at high risk of being infected with HIV. If you choose to begin PrEP, you should first be tested for HIV. You should then be tested every 3 months for as   long as you are taking PrEP.  PREGNANCY   If you are premenopausal and you may become pregnant, ask your health care provider about preconception counseling.  If you may become pregnant, take 400 to 800 micrograms (mcg) of folic acid every day.  If you want to prevent pregnancy, talk to your health care provider about birth control (contraception). OSTEOPOROSIS AND MENOPAUSE   Osteoporosis is a disease in which the bones lose minerals and strength with aging. This can result in serious bone fractures. Your risk for osteoporosis can be identified using a bone density scan.  If you are 27 years of age or older, or if you are at risk for osteoporosis and fractures, ask your health care provider if you should be screened.  Ask your health care provider whether you should take a calcium or vitamin D supplement to lower your risk for osteoporosis.  Menopause may have certain physical symptoms and risks.  Hormone replacement therapy may reduce some of these symptoms and risks. Talk to your health care provider about whether hormone replacement therapy is right for you.  HOME CARE INSTRUCTIONS   Schedule regular health, dental, and eye exams.  Stay current with your immunizations.   Do not use any tobacco products including cigarettes, chewing tobacco, or electronic cigarettes.  If you are pregnant, do not drink alcohol.  If you are breastfeeding, limit how much and how often you drink alcohol.  Limit alcohol intake to no more than 1 drink per day for nonpregnant women. One drink equals 12 ounces of beer, 5  ounces of wine, or 1 ounces of hard liquor.  Do not use street drugs.  Do not share needles.  Ask your health care provider for help if you need support or information about quitting drugs.  Tell your health care provider if you often feel depressed.  Tell your health care provider if you have ever been abused or do not feel safe at home.   This information is not intended to replace advice given to you by your health care provider. Make sure you discuss any questions you have with your health care provider.   Document Released: 12/16/2010 Document Revised: 06/23/2014 Document Reviewed: 05/04/2013 Elsevier Interactive Patient Education [employer].

## 2016-04-24 ENCOUNTER — Telehealth: Payer: Self-pay | Admitting: Internal Medicine

## 2016-05-01 NOTE — Telephone Encounter (Signed)
Error

## 2016-06-17 ENCOUNTER — Other Ambulatory Visit: Payer: 59

## 2016-06-18 DIAGNOSIS — M5137 Other intervertebral disc degeneration, lumbosacral region: Secondary | ICD-10-CM | POA: Diagnosis not present

## 2016-06-18 DIAGNOSIS — M9903 Segmental and somatic dysfunction of lumbar region: Secondary | ICD-10-CM | POA: Diagnosis not present

## 2016-06-18 DIAGNOSIS — M5136 Other intervertebral disc degeneration, lumbar region: Secondary | ICD-10-CM | POA: Diagnosis not present

## 2016-06-24 ENCOUNTER — Encounter: Payer: 59 | Admitting: Internal Medicine

## 2016-07-15 ENCOUNTER — Other Ambulatory Visit: Payer: Self-pay | Admitting: Internal Medicine

## 2016-07-15 DIAGNOSIS — Z1231 Encounter for screening mammogram for malignant neoplasm of breast: Secondary | ICD-10-CM

## 2016-08-26 ENCOUNTER — Ambulatory Visit
Admission: RE | Admit: 2016-08-26 | Discharge: 2016-08-26 | Disposition: A | Discharge status: Home / Self Care | Payer: 59 | Source: Ambulatory Visit | Attending: Internal Medicine | Admitting: Internal Medicine

## 2016-08-26 DIAGNOSIS — Z1231 Encounter for screening mammogram for malignant neoplasm of breast: Secondary | ICD-10-CM | POA: Diagnosis not present

## 2016-09-02 DIAGNOSIS — M5136 Other intervertebral disc degeneration, lumbar region: Secondary | ICD-10-CM | POA: Diagnosis not present

## 2016-09-02 DIAGNOSIS — M5137 Other intervertebral disc degeneration, lumbosacral region: Secondary | ICD-10-CM | POA: Diagnosis not present

## 2016-09-02 DIAGNOSIS — M9903 Segmental and somatic dysfunction of lumbar region: Secondary | ICD-10-CM | POA: Diagnosis not present

## 2016-09-10 NOTE — Progress Notes (Signed)
Chief Complaint  Patient presents with  . Annual Exam    HPI: Patient  Dana Bailey  61 y.o. comes in today for Preventive Health Care visit  6Since her last visit she's been doing well. She is felt to be cured of her hepatitis C. No family history of colon cancer. Had renal stone calcium per dr borden no recurrence   bp at home in low 130  Over below 80   Health Maintenance  Topic Date Due  . COLONOSCOPY  10/21/2005  . PAP SMEAR  07/07/2017 (Originally 07/08/2015)  . INFLUENZA VACCINE  01/14/2017  . MAMMOGRAM  08/27/2018  . TETANUS/TDAP  09/18/2021  . Hepatitis C Screening  Completed  . HIV Screening  Completed   Health Maintenance Review LIFESTYLE:  Exercise:   Walks every day .  Tobacco/ETS: no Alcohol:   About 1 per month  Sugar beverages:  ocass sweet tea weak   Sleep: about 6 hours   Drug use: no HH of  2   1 dog  Work:  Multiple   [employer]  About 25 hours per week.  Mom  falling    And health risk  eatsreasnally healthy  Grand kids  help   ROS:  GEN/ HEENT: No fever, significant weight changes sweats headaches vision problems hearing changes, CV/ PULM; No chest pain shortness of breath cough, syncope,edema  change in exercise tolerance. GI /GU: No adominal pain, vomiting, change in bowel habits. No blood in the stool. No significant GU symptoms. SKIN/HEME: ,no acute skin rashes suspicious lesions or bleeding. No lymphadenopathy, nodules, masses.  NEURO/ PSYCH:  No neurologic signs such as weakness numbness. No depression anxiety. IMM/ Allergy: No unusual infections.  Allergy .   REST of 12 system review negative except as per HPI   Past Medical History:  Diagnosis Date  . Chicken pox   . Congenital heart defect   . Hay fever   . Hepatitis     hepatitis C from blood transfusion  . 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 High blood pressure    white coat syndrome  . Hx of echocardiogram 2013   nl except 2 diastolic dysfunction nl lv   no pht,no asd  . Hx of heart surgery    asd closure  . Kidney stones [id number]  . Need for rhogam due to Rh negative mother    x 2 1982 and 1986  . Shingles 12/2011  . Transfusion history 1974   4 units  1 from father  heart surgery    Past Surgical History:  Procedure Laterality Date  . ASD REPAIR  1974   Age 13  . OVARIAN CYST REMOVAL  1998   Laparoscopic 1998  . TONSILECTOMY, ADENOIDECTOMY, BILATERAL MYRINGOTOMY AND TUBES  1962    Family History  Problem Relation Age of Onset  . Arthritis Mother     Degenerative disc disease  . Hyperlipidemia Mother   . Hypertension Mother   . Kidney disease Mother   . Mental illness Mother   . Arthritis Father   . Hyperlipidemia Father   . Heart disease Father   . Stroke Father   . Hypertension Father   . Diabetes  Father   . Alzheimer's disease Father   . Glaucoma    . Macular degeneration    . Breast cancer Sister     Social History   Social History  . Marital status: Married    Spouse name: N/A  . Number of children: N/A  . Years of education: N/A   Social History Main Topics  . Smoking status: Never Smoker  . Smokeless tobacco: Never Used  . Alcohol use Yes     Comment: rarely  . Drug use: No  . Sexual activity: Not Asked   Other Topics Concern  . None   Social History Narrative   Household of 2 married   PET your T2    BS in math  [employer]  born [place]   In [place] in [place] 2 para 2 [phone]   Negative TAD seatbelts at a smoke alarm.   Last Pap 6 2007 last period 3 2000 and 5T depth for 5 2013 no recent mammogram no colonoscopy    Outpatient Medications Prior to Visit  Medication Sig Dispense Refill  . fexofenadine (ALLEGRA) 180 MG  tablet Take 180 mg by mouth daily.     No facility-administered medications prior to visit.      EXAM:  BP 134/90 (BP Location: Right Arm, Patient Position: Sitting, Cuff Size: Normal)   Pulse 70   Temp 98.2 F (36.8 C) (Oral)   Ht 5' 2.5" (1.588 m)   Wt 180 lb (81.6 kg)   BMI 32.40 kg/m   Body mass index is 32.4 kg/m. Wt Readings from Last 3 Encounters:  09/15/16 180 lb (81.6 kg)  05/22/15 182 lb 11.2 oz (82.9 kg)  12/19/13 189 lb (85.7 kg)    Physical Exam: Vital signs reviewed GXQ:JJHE is a well-developed well-nourished alert cooperative    who appearsr stated age in no acute distress.  HEENT: normocephalic atraumatic , Eyes: PERRL EOM's full, conjunctiva clear, Nares: paten,t no deformity discharge or tenderness., Ears: no deformity EAC's clear TMs with normal landmarks. Mouth: clear OP, no lesions, edema.  Moist mucous membranes. Dentition in adequate repair. NECK: supple without masses, thyromegaly or bruits. CHEST/PULM:  Clear to auscultation and percussion breath sounds equal no wheeze , rales or rhonchi. No chest wall deformities or tenderness. Breast: normal by inspection . No dimpling, discharge, masses, tenderness or discharge . CV: PMI is nondisplaced, S1 S2 no gallops, murmurs, rubs. Peripheral pulses are full without delay.No JVD .  ABDOMEN: Bowel sounds normal nontender  No guard or rebound, no hepato splenomegal no CVA tenderness.  No hernia. Extremtities:  No clubbing cyanosis or edema, no acute joint swelling or redness no focal atrophy NEURO:  Oriented x3, cranial nerves 3-12 appear to be intact, no obvious focal weakness,gait within normal limits no abnormal reflexes or asymmetrical SKIN: No acute rashes normal turgor, color, no bruising or petechiae. PSYCH: Oriented, good eye contact, no obvious depression anxiety, cognition and judgment appear normal. LN: no cervical axillary inguinal adenopathy  Lab Results  Component Value Date   WBC 5.1 05/15/2015     HGB 14.4 05/15/2015   HCT 43.2 05/15/2015   PLT 258.0 05/15/2015   GLUCOSE 83 05/15/2015   CHOL 198 05/15/2015   TRIG 110.0 05/15/2015   HDL 43.40 05/15/2015   LDLCALC 133 (H) 05/15/2015   ALT 12 05/15/2015   AST 14 05/15/2015   NA 141 05/15/2015   K 4.1 05/15/2015   CL 105 05/15/2015  CREATININE 0.72 05/15/2015   BUN 17 05/15/2015   CO2 26 05/15/2015   TSH 1.99 05/15/2015    BP Readings from Last 3 Encounters:  09/15/16 134/90  05/22/15 130/80  12/19/13 (!) 144/90   Wt Readings from Last 3 Encounters:  09/15/16 180 lb (81.6 kg)  05/22/15 182 lb 11.2 oz (82.9 kg)  12/19/13 189 lb (85.7 kg)     Lab results reviewed with patient   ASSESSMENT AND PLAN:  Discussed the following assessment and plan:  Visit for preventive health examination - revewied  hcm etc utd  - Plan: Basic metabolic panel, CBC with Differential/Platelet, Hepatic function panel, Lipid panel, TSH  BMI 32.0-32.9,adult Screening; prefers stool tests will check on cologuard average risk  Also  rx for  shingrix printed . To check on .  Lab monitoring hx of hep c rx .  bp check at home to make sure at goal  Disc   Dash and eating etc  Healthy weight loss   Patient Care Team: Burnis Medin, MD as PCP - General (Internal Medicine) Hadassah Pais as Consulting Physician (Internal Medicine) Patient Instructions  Will notify you  of labs when available. Make sure  bp readings  aare below 140/90  Goal is  130/85 and below    Dash eating .  Fu if    bp elevated .   Check into cologuard for screening  And shingrix  Shingles vaccine .   If all ok the  CPX in a year     DASH Eating Plan DASH stands for "Dietary Approaches to Stop Hypertension." The DASH eating plan is a healthy eating plan that has been shown to reduce high blood pressure (hypertension). It may also reduce your risk for type 2 diabetes, heart disease, and stroke. The DASH eating plan may also help with weight loss. What are tips for  following this plan? General guidelines   Avoid eating more than 2,300 mg (milligrams) of salt (sodium) a day. If you have hypertension, you may need to reduce your sodium intake to 1,500 mg a day.  Limit alcohol intake to no more than 1 drink a day for nonpregnant women and 2 drinks a day for men. One drink equals 12 oz of beer, 5 oz of wine, or 1 oz of hard liquor.  Work with your health care provider to maintain a healthy body weight or to lose weight. Ask what an ideal weight is for you.  Get at least 30 minutes of exercise that causes your heart to beat faster (aerobic exercise) most days of the week. Activities may include walking, swimming, or biking.  Work with your health care provider or diet and nutrition specialist (dietitian) to adjust your eating plan to your individual calorie needs. Reading food labels   Check food labels for the amount of sodium per serving. Choose foods with less than 5 percent of the Daily Value of sodium. Generally, foods with less than 300 mg of sodium per serving fit into this eating plan.  To find whole grains, look for the word "whole" as the first word in the ingredient list. Shopping   Buy products labeled as "low-sodium" or "no salt added."  Buy fresh foods. Avoid canned foods and premade or frozen meals. Cooking   Avoid adding salt when cooking. Use salt-free seasonings or herbs instead of table salt or sea salt. Check with your health care provider or pharmacist before using salt substitutes.  Do not fry foods. Cook foods using  healthy methods such as baking, boiling, grilling, and broiling instead.  Cook with heart-healthy oils, such as olive, canola, soybean, or sunflower oil. Meal planning    Eat a balanced diet that includes:  5 or more servings of fruits and vegetables each day. At each meal, try to fill half of your plate with fruits and vegetables.  Up to 6-8 servings of whole grains each day.  Less than 6 oz of lean meat,  poultry, or fish each day. A 3-oz serving of meat is about the same size as a deck of cards. One egg equals 1 oz.  2 servings of low-fat dairy each day.  A serving of nuts, seeds, or beans 5 times each week.  Heart-healthy fats. Healthy fats called Omega-3 fatty acids are found in foods such as flaxseeds and coldwater fish, like sardines, salmon, and mackerel.  Limit how much you eat of the following:  Canned or prepackaged foods.  Food that is high in trans fat, such as fried foods.  Food that is high in saturated fat, such as fatty meat.  Sweets, desserts, sugary drinks, and other foods with added sugar.  Full-fat dairy products.  Do not salt foods before eating.  Try to eat at least 2 vegetarian meals each week.  Eat more home-cooked food and less restaurant, buffet, and fast food.  When eating at a restaurant, ask that your food be prepared with less salt or no salt, if possible. What foods are recommended? The items listed may not be a complete list. Talk with your dietitian about what dietary choices are best for you. Grains  Whole-grain or whole-wheat bread. Whole-grain or whole-wheat pasta. Brown rice. [place]ena Morrow. Bulgur. Whole-grain and low-sodium cereals. Pita bread. Low-fat, low-sodium crackers. Whole-wheat flour tortillas. Vegetables  Fresh or frozen vegetables (raw, steamed, roasted, or grilled). Low-sodium or reduced-sodium tomato and vegetable juice. Low-sodium or reduced-sodium tomato sauce and tomato paste. Low-sodium or reduced-sodium canned vegetables. Fruits  All fresh, dried, or frozen fruit. Canned fruit in natural juice (without added sugar). Meat and other protein foods  Skinless chicken or [place]. Ground chicken or [place]. Pork with fat trimmed off. Fish and seafood. Egg whites. Dried beans, peas, or lentils. Unsalted nuts, nut butters, and seeds. Unsalted canned beans. Lean cuts of beef with fat trimmed off. Low-sodium, lean deli meat. Dairy    Low-fat (1%) or fat-free (skim) milk. Fat-free, low-fat, or reduced-fat cheeses. Nonfat, low-sodium ricotta or cottage cheese. Low-fat or nonfat yogurt. Low-fat, low-sodium cheese. Fats and oils  Soft margarine without trans fats. Vegetable oil. Low-fat, reduced-fat, or light mayonnaise and salad dressings (reduced-sodium). Canola, safflower, olive, soybean, and sunflower oils. Avocado. Seasoning and other foods  Herbs. Spices. Seasoning mixes without salt. Unsalted popcorn and pretzels. Fat-free sweets. What foods are not recommended? The items listed may not be a complete list. Talk with your dietitian about what dietary choices are best for you. Grains  Baked goods made with fat, such as croissants, muffins, or some breads. Dry pasta or rice meal packs. Vegetables  Creamed or fried vegetables. Vegetables in a cheese sauce. Regular canned vegetables (not low-sodium or reduced-sodium). Regular canned tomato sauce and paste (not low-sodium or reduced-sodium). Regular tomato and vegetable juice (not low-sodium or reduced-sodium). Angie Fava. Olives. Fruits  Canned fruit in a light or heavy syrup. Fried fruit. Fruit in cream or butter sauce. Meat and other protein foods  Fatty cuts of meat. Ribs. Fried meat. Berniece Salines. Sausage. Bologna and other processed lunch meats. Salami. Fatback. Hotdogs. Bratwurst.  Salted nuts and seeds. Canned beans with added salt. Canned or smoked fish. Whole eggs or egg yolks. Chicken or [place] with skin. Dairy  Whole or 2% milk, cream, and half-and-half. Whole or full-fat cream cheese. Whole-fat or sweetened yogurt. Full-fat cheese. Nondairy creamers. Whipped toppings. Processed cheese and cheese spreads. Fats and oils  Butter. Stick margarine. Lard. Shortening. Ghee. Bacon fat. Tropical oils, such as coconut, palm kernel, or palm oil. Seasoning and other foods  Salted popcorn and pretzels. Onion salt, garlic salt, seasoned salt, table salt, and sea salt. Worcestershire  sauce. Tartar sauce. Barbecue sauce. Teriyaki sauce. Soy sauce, including reduced-sodium. Steak sauce. Canned and packaged gravies. Fish sauce. Oyster sauce. Cocktail sauce. Horseradish that you find on the shelf. Ketchup. Mustard. Meat flavorings and tenderizers. Bouillon cubes. Hot sauce and Tabasco sauce. Premade or packaged marinades. Premade or packaged taco seasonings. Relishes. Regular salad dressings. Where to find more information:  National Heart, Lung, and [place]: https://wilson-eaton.com/  American Heart Association: www.heart.org Summary  The DASH eating plan is a healthy eating plan that has been shown to reduce high blood pressure (hypertension). It may also reduce your risk for type 2 diabetes, heart disease, and stroke.  With the DASH eating plan, you should limit salt (sodium) intake to 2,300 mg a day. If you have hypertension, you may need to reduce your sodium intake to 1,500 mg a day.  When on the DASH eating plan, aim to eat more fresh fruits and vegetables, whole grains, lean proteins, low-fat dairy, and heart-healthy fats.  Work with your health care provider or diet and nutrition specialist (dietitian) to adjust your eating plan to your individual calorie needs. This information is not intended to replace advice given to you by your health care provider. Make sure you discuss any questions you have with your health care provider. Document Released: 05/22/2011 Document Revised: 05/26/2016 Document Reviewed: 05/26/2016 Elsevier Interactive Patient Education  2017 [place] Maintenance, Female Adopting a healthy lifestyle and getting preventive care can go a long way to promote health and wellness. Talk with your health care provider about what schedule of regular examinations is right for you. This is a good chance for you to check in with your provider about disease prevention and staying healthy. In between checkups, there are plenty of things you can do on  your own. Experts have done a lot of research about which lifestyle changes and preventive measures are most likely to keep you healthy. Ask your health care provider for more information. Weight and diet Eat a healthy diet  Be sure to include plenty of vegetables, fruits, low-fat dairy products, and lean protein.  Do not eat a lot of foods high in solid fats, added sugars, or salt.  Get regular exercise. This is one of the most important things you can do for your health.  Most adults should exercise for at least 150 minutes each week. The exercise should increase your heart rate and make you sweat (moderate-intensity exercise).  Most adults should also do strengthening exercises at least twice a week. This is in addition to the moderate-intensity exercise. Maintain a healthy weight  Body mass index (BMI) is a measurement that can be used to identify possible weight problems. It estimates body fat based on height and weight. Your health care provider can help determine your BMI and help you achieve or maintain a healthy weight.  For females 53 years of age and older:  A BMI below 18.5 is considered underweight.  A BMI of 18.5 to 24.9 is normal.  A BMI of 25 to 29.9 is considered overweight.  A BMI of 30 and above is considered obese. Watch levels of cholesterol and blood lipids  You should start having your blood tested for lipids and cholesterol at 61 years of age, then have this test every 5 years.  You may need to have your cholesterol levels checked more often if:  Your lipid or cholesterol levels are high.  You are older than 61 years of age.  You are at high risk for heart disease. Cancer screening Lung Cancer  Lung cancer screening is recommended for adults 62-60 years old who are at high risk for lung cancer because of a history of smoking.  A yearly low-dose CT scan of the lungs is recommended for people who:  Currently smoke.  Have quit within the past 15  years.  Have at least a 30-pack-year history of smoking. A pack year is smoking an average of one pack of cigarettes a day for 1 year.  Yearly screening should continue until it has been 15 years since you quit.  Yearly screening should stop if you develop a health problem that would prevent you from having lung cancer treatment. Breast Cancer  Practice breast self-awareness. This means understanding how your breasts normally appear and feel.  It also means doing regular breast self-exams. Let your health care provider know about any changes, no matter how small.  If you are in your 20s or 30s, you should have a clinical breast exam (CBE) by a health care provider every 1-3 years as part of a regular health exam.  If you are 52 or older, have a CBE every year. Also consider having a breast X-ray (mammogram) every year.  If you have a family history of breast cancer, talk to your health care provider about genetic screening.  If you are at high risk for breast cancer, talk to your health care provider about having an MRI and a mammogram every year.  Breast cancer gene (BRCA) assessment is recommended for women who have family members with BRCA-related cancers. BRCA-related cancers include:  Breast.  Ovarian.  Tubal.  Peritoneal cancers.  Results of the assessment will determine the need for genetic counseling and BRCA1 and BRCA2 testing. Cervical Cancer  Your health care provider may recommend that you be screened regularly for cancer of the pelvic organs (ovaries, uterus, and vagina). This screening involves a pelvic examination, including checking for microscopic changes to the surface of your cervix (Pap test). You may be encouraged to have this screening done every 3 years, beginning at age 42.  For women ages 49-65, health care providers may recommend pelvic exams and Pap testing every 3 years, or they may recommend the Pap and pelvic exam, combined with testing for human  papilloma virus (HPV), every 5 years. Some types of HPV increase your risk of cervical cancer. Testing for HPV may also be done on women of any age with unclear Pap test results.  Other health care providers may not recommend any screening for nonpregnant women who are considered low risk for pelvic cancer and who do not have symptoms. Ask your health care provider if a screening pelvic exam is right for you.  If you have had past treatment for cervical cancer or a condition that could lead to cancer, you need Pap tests and screening for cancer for at least 20 years after your treatment. If Pap tests have been discontinued, your  risk factors (such as having a new sexual partner) need to be reassessed to determine if screening should resume. Some women have medical problems that increase the chance of getting cervical cancer. In these cases, your health care provider may recommend more frequent screening and Pap tests. Colorectal Cancer  This type of cancer can be detected and often prevented.  Routine colorectal cancer screening usually begins at 61 years of age and continues through 61 years of age.  Your health care provider may recommend screening at an earlier age if you have risk factors for colon cancer.  Your health care provider may also recommend using home test kits to check for hidden blood in the stool.  A small camera at the end of a tube can be used to examine your colon directly (sigmoidoscopy or colonoscopy). This is done to check for the earliest forms of colorectal cancer.  Routine screening usually begins at age 75.  Direct examination of the colon should be repeated every 5-10 years through 61 years of age. However, you may need to be screened more often if early forms of precancerous polyps or small growths are found. Skin Cancer  Check your skin from head to toe regularly.  Tell your health care provider about any new moles or changes in moles, especially if there is a  change in a mole's shape or color.  Also tell your health care provider if you have a mole that is larger than the size of a pencil eraser.  Always use sunscreen. Apply sunscreen liberally and repeatedly throughout the day.  Protect yourself by wearing long sleeves, pants, a wide-brimmed hat, and sunglasses whenever you are outside. Heart disease, diabetes, and high blood pressure  High blood pressure causes heart disease and increases the risk of stroke. High blood pressure is more likely to develop in:  People who have blood pressure in the high end of the normal range (130-139/85-89 mm Hg).  People who are overweight or obese.  People who are African American.  If you are 35-11 years of age, have your blood pressure checked every 3-5 years. If you are 59 years of age or older, have your blood pressure checked every year. You should have your blood pressure measured twice-once when you are at a hospital or clinic, and once when you are not at a hospital or clinic. Record the average of the two measurements. To check your blood pressure when you are not at a hospital or clinic, you can use:  An automated blood pressure machine at a pharmacy.  A home blood pressure monitor.  If you are between 49 years and 28 years old, ask your health care provider if you should take aspirin to prevent strokes.  Have regular diabetes screenings. This involves taking a blood sample to check your fasting blood sugar level.  If you are at a normal weight and have a low risk for diabetes, have this test once every three years after 61 years of age.  If you are overweight and have a high risk for diabetes, consider being tested at a younger age or more often. Preventing infection Hepatitis B  If you have a higher risk for hepatitis B, you should be screened for this virus. You are considered at high risk for hepatitis B if:  You were born in a country where hepatitis B is common. Ask your health care  provider which countries are considered high risk.  Your parents were born in a high-risk country, and you  have not been immunized against hepatitis B (hepatitis B vaccine).  You have HIV or AIDS.  You use needles to inject street drugs.  You live with someone who has hepatitis B.  You have had sex with someone who has hepatitis B.  You get hemodialysis treatment.  You take certain medicines for conditions, including cancer, organ transplantation, and autoimmune conditions. Hepatitis C  Blood testing is recommended for:  Everyone born from [birth year] through 1965.  Anyone with known risk factors for hepatitis C. Sexually transmitted infections (STIs)  You should be screened for sexually transmitted infections (STIs) including gonorrhea and chlamydia if:  You are sexually active and are younger than 61 years of age.  You are older than 61 years of age and your health care provider tells you that you are at risk for this type of infection.  Your sexual activity has changed since you were last screened and you are at an increased risk for chlamydia or gonorrhea. Ask your health care provider if you are at risk.  If you do not have HIV, but are at risk, it may be recommended that you take a prescription medicine daily to prevent HIV infection. This is called pre-exposure prophylaxis (PrEP). You are considered at risk if:  You are sexually active and do not regularly use condoms or know the HIV status of your partner(s).  You take drugs by injection.  You are sexually active with a partner who has HIV. Talk with your health care provider about whether you are at high risk of being infected with HIV. If you choose to begin PrEP, you should first be tested for HIV. You should then be tested every 3 months for as long as you are taking PrEP. Pregnancy  If you are premenopausal and you may become pregnant, ask your health care provider about preconception counseling.  If you may become  pregnant, take 400 to 800 micrograms (mcg) of folic acid every day.  If you want to prevent pregnancy, talk to your health care provider about birth control (contraception). Osteoporosis and menopause  Osteoporosis is a disease in which the bones lose minerals and strength with aging. This can result in serious bone fractures. Your risk for osteoporosis can be identified using a bone density scan.  If you are 8 years of age or older, or if you are at risk for osteoporosis and fractures, ask your health care provider if you should be screened.  Ask your health care provider whether you should take a calcium or vitamin D supplement to lower your risk for osteoporosis.  Menopause may have certain physical symptoms and risks.  Hormone replacement therapy may reduce some of these symptoms and risks. Talk to your health care provider about whether hormone replacement therapy is right for you. Follow these instructions at home:  Schedule regular health, dental, and eye exams.  Stay current with your immunizations.  Do not use any tobacco products including cigarettes, chewing tobacco, or electronic cigarettes.  If you are pregnant, do not drink alcohol.  If you are breastfeeding, limit how much and how often you drink alcohol.  Limit alcohol intake to no more than 1 drink per day for nonpregnant women. One drink equals 12 ounces of beer, 5 ounces of wine, or 1 ounces of hard liquor.  Do not use street drugs.  Do not share needles.  Ask your health care provider for help if you need support or information about quitting drugs.  Tell your health care provider if  you often feel depressed.  Tell your health care provider if you have ever been abused or do not feel safe at home. This information is not intended to replace advice given to you by your health care provider. Make sure you discuss any questions you have with your health care provider. Document Released: 12/16/2010 Document  Revised: 11/08/2015 Document Reviewed: 03/06/2015 Elsevier Interactive Patient Education  2017 Stacy K. Panosh M.D.

## 2016-09-11 ENCOUNTER — Other Ambulatory Visit: Payer: 59

## 2016-09-15 ENCOUNTER — Ambulatory Visit (INDEPENDENT_AMBULATORY_CARE_PROVIDER_SITE_OTHER): Payer: 59 | Admitting: Internal Medicine

## 2016-09-15 ENCOUNTER — Encounter: Payer: Self-pay | Admitting: Internal Medicine

## 2016-09-15 VITALS — BP 134/90 | HR 70 | Temp 98.2°F | Ht 62.5 in | Wt 180.0 lb

## 2016-09-15 DIAGNOSIS — Z0000 Encounter for general adult medical examination without abnormal findings: Secondary | ICD-10-CM | POA: Diagnosis not present

## 2016-09-15 DIAGNOSIS — Z6832 Body mass index (BMI) 32.0-32.9, adult: Secondary | ICD-10-CM | POA: Diagnosis not present

## 2016-09-15 LAB — HEPATIC FUNCTION PANEL
ALBUMIN: 4.7 g/dL (ref 3.5–5.2)
ALK PHOS: 50 U/L (ref 39–117)
ALT: 10 U/L (ref 0–35)
AST: 14 U/L (ref 0–37)
Bilirubin, Direct: 0.1 mg/dL (ref 0.0–0.3)
TOTAL PROTEIN: 7 g/dL (ref 6.0–8.3)
Total Bilirubin: 0.6 mg/dL (ref 0.2–1.2)

## 2016-09-15 LAB — CBC WITH DIFFERENTIAL/PLATELET
Basophils Absolute: 0 10*3/uL (ref 0.0–0.1)
Basophils Relative: 0.8 % (ref 0.0–3.0)
EOS PCT: 1.8 % (ref 0.0–5.0)
Eosinophils Absolute: 0.1 10*3/uL (ref 0.0–0.7)
HEMATOCRIT: 41.7 % (ref 36.0–46.0)
Hemoglobin: 14.4 g/dL (ref 12.0–15.0)
LYMPHS ABS: 2.2 10*3/uL (ref 0.7–4.0)
Lymphocytes Relative: 39.9 % (ref 12.0–46.0)
MCHC: 34.6 g/dL (ref 30.0–36.0)
MCV: 81.1 fl (ref 78.0–100.0)
MONOS PCT: 6.6 % (ref 3.0–12.0)
Monocytes Absolute: 0.4 10*3/uL (ref 0.1–1.0)
NEUTROS ABS: 2.8 10*3/uL (ref 1.4–7.7)
NEUTROS PCT: 50.9 % (ref 43.0–77.0)
Platelets: 253 10*3/uL (ref 150.0–400.0)
RBC: 5.14 Mil/uL — AB (ref 3.87–5.11)
RDW: 13.5 % (ref 11.5–15.5)
WBC: 5.6 10*3/uL (ref 4.0–10.5)

## 2016-09-15 LAB — BASIC METABOLIC PANEL
BUN: 13 mg/dL (ref 6–23)
CO2: 27 mEq/L (ref 19–32)
Calcium: 9.4 mg/dL (ref 8.4–10.5)
Chloride: 103 mEq/L (ref 96–112)
Creatinine, Ser: 0.74 mg/dL (ref 0.40–1.20)
GFR: 84.83 mL/min (ref >60.00)
GLUCOSE: 85 mg/dL (ref 70–99)
Potassium: 4.4 mEq/L (ref 3.5–5.1)
SODIUM: 138 meq/L (ref 135–145)

## 2016-09-15 LAB — LIPID PANEL
Cholesterol: 192 mg/dL (ref 0–200)
HDL: 48.3 mg/dL (ref >39.00)
LDL Cholesterol: 124 mg/dL — ABNORMAL HIGH (ref 0–99)
NONHDL: 143.27
TRIGLYCERIDES: 96 mg/dL (ref 0.0–149.0)
Total CHOL/HDL Ratio: 4
VLDL: 19.2 mg/dL (ref 0.0–40.0)

## 2016-09-15 LAB — TSH: TSH: 2.26 u[IU]/mL (ref 0.35–4.50)

## 2016-09-15 NOTE — Patient Instructions (Signed)
Will notify you  of labs when available. Make sure  bp readings  aare below 140/90  Goal is  130/85 and below    Dash eating .  Fu if    bp elevated .   Check into cologuard for screening  And shingrix  Shingles vaccine .   If all ok the  CPX in a year     DASH Eating Plan DASH stands for "Dietary Approaches to Stop Hypertension." The DASH eating plan is a healthy eating plan that has been shown to reduce high blood pressure (hypertension). It may also reduce your risk for type 2 diabetes, heart disease, and stroke. The DASH eating plan may also help with weight loss. What are tips for following this plan? General guidelines   Avoid eating more than 2,300 mg (milligrams) of salt (sodium) a day. If you have hypertension, you may need to reduce your sodium intake to 1,500 mg a day.  Limit alcohol intake to no more than 1 drink a day for nonpregnant women and 2 drinks a day for men. One drink equals 12 oz of beer, 5 oz of wine, or 1 oz of hard liquor.  Work with your health care provider to maintain a healthy body weight or to lose weight. Ask what an ideal weight is for you.  Get at least 30 minutes of exercise that causes your heart to beat faster (aerobic exercise) most days of the week. Activities may include walking, swimming, or biking.  Work with your health care provider or diet and nutrition specialist (dietitian) to adjust your eating plan to your individual calorie needs. Reading food labels   Check food labels for the amount of sodium per serving. Choose foods with less than 5 percent of the Daily Value of sodium. Generally, foods with less than 300 mg of sodium per serving fit into this eating plan.  To find whole grains, look for the word "whole" as the first word in the ingredient list. Shopping   Buy products labeled as "low-sodium" or "no salt added."  Buy fresh foods. Avoid canned foods and premade or frozen meals. Cooking   Avoid adding salt when cooking. Use  salt-free seasonings or herbs instead of table salt or sea salt. Check with your health care provider or pharmacist before using salt substitutes.  Do not fry foods. Cook foods using healthy methods such as baking, boiling, grilling, and broiling instead.  Cook with heart-healthy oils, such as olive, canola, soybean, or sunflower oil. Meal planning    Eat a balanced diet that includes:  5 or more servings of fruits and vegetables each day. At each meal, try to fill half of your plate with fruits and vegetables.  Up to 6-8 servings of whole grains each day.  Less than 6 oz of lean meat, poultry, or fish each day. A 3-oz serving of meat is about the same size as a deck of cards. One egg equals 1 oz.  2 servings of low-fat dairy each day.  A serving of nuts, seeds, or beans 5 times each week.  Heart-healthy fats. Healthy fats called Omega-3 fatty acids are found in foods such as flaxseeds and coldwater fish, like sardines, salmon, and mackerel.  Limit how much you eat of the following:  Canned or prepackaged foods.  Food that is high in trans fat, such as fried foods.  Food that is high in saturated fat, such as fatty meat.  Sweets, desserts, sugary drinks, and other foods with added sugar.  Full-fat dairy products.  Do not salt foods before eating.  Try to eat at least 2 vegetarian meals each week.  Eat more home-cooked food and less restaurant, buffet, and fast food.  When eating at a restaurant, ask that your food be prepared with less salt or no salt, if possible. What foods are recommended? The items listed may not be a complete list. Talk with your dietitian about what dietary choices are best for you. Grains  Whole-grain or whole-wheat bread. Whole-grain or whole-wheat pasta. Brown rice. [place]. Bulgur. Whole-grain and low-sodium cereals. Pita bread. Low-fat, low-sodium crackers. Whole-wheat flour tortillas. Vegetables  Fresh or frozen vegetables (raw,  steamed, roasted, or grilled). Low-sodium or reduced-sodium tomato and vegetable juice. Low-sodium or reduced-sodium tomato sauce and tomato paste. Low-sodium or reduced-sodium canned vegetables. Fruits  All fresh, dried, or frozen fruit. Canned fruit in natural juice (without added sugar). Meat and other protein foods  Skinless chicken or [place]. Ground chicken or [place]. Pork with fat trimmed off. Fish and seafood. Egg whites. Dried beans, peas, or lentils. Unsalted nuts, nut butters, and seeds. Unsalted canned beans. Lean cuts of beef with fat trimmed off. Low-sodium, lean deli meat. Dairy  Low-fat (1%) or fat-free (skim) milk. Fat-free, low-fat, or reduced-fat cheeses. Nonfat, low-sodium ricotta or cottage cheese. Low-fat or nonfat yogurt. Low-fat, low-sodium cheese. Fats and oils  Soft margarine without trans fats. Vegetable oil. Low-fat, reduced-fat, or light mayonnaise and salad dressings (reduced-sodium). Canola, safflower, olive, soybean, and sunflower oils. Avocado. Seasoning and other foods  Herbs. Spices. Seasoning mixes without salt. Unsalted popcorn and pretzels. Fat-free sweets. What foods are not recommended? The items listed may not be a complete list. Talk with your dietitian about what dietary choices are best for you. Grains  Baked goods made with fat, such as croissants, muffins, or some breads. Dry pasta or rice meal packs. Vegetables  Creamed or fried vegetables. Vegetables in a cheese sauce. Regular canned vegetables (not low-sodium or reduced-sodium). Regular canned tomato sauce and paste (not low-sodium or reduced-sodium). Regular tomato and vegetable juice (not low-sodium or reduced-sodium). Angie Fava. Olives. Fruits  Canned fruit in a light or heavy syrup. Fried fruit. Fruit in cream or butter sauce. Meat and other protein foods  Fatty cuts of meat. Ribs. Fried meat. Berniece Salines. Sausage. Bologna and other processed lunch meats. Salami. Fatback. Hotdogs. Bratwurst. Salted nuts  and seeds. Canned beans with added salt. Canned or smoked fish. Whole eggs or egg yolks. Chicken or [place] with skin. Dairy  Whole or 2% milk, cream, and half-and-half. Whole or full-fat cream cheese. Whole-fat or sweetened yogurt. Full-fat cheese. Nondairy creamers. Whipped toppings. Processed cheese and cheese spreads. Fats and oils  Butter. Stick margarine. Lard. Shortening. Ghee. Bacon fat. Tropical oils, such as coconut, palm kernel, or palm oil. Seasoning and other foods  Salted popcorn and pretzels. Onion salt, garlic salt, seasoned salt, table salt, and sea salt. Worcestershire sauce. Tartar sauce. Barbecue sauce. Teriyaki sauce. Soy sauce, including reduced-sodium. Steak sauce. Canned and packaged gravies. Fish sauce. Oyster sauce. Cocktail sauce. Horseradish that you find on the shelf. Ketchup. Mustard. Meat flavorings and tenderizers. Bouillon cubes. Hot sauce and Tabasco sauce. Premade or packaged marinades. Premade or packaged taco seasonings. Relishes. Regular salad dressings. Where to find more information:  National Heart, Lung, and [place]: https://wilson-eaton.com/  American Heart Association: www.heart.org Summary  The DASH eating plan is a healthy eating plan that has been shown to reduce high blood pressure (hypertension). It may also reduce your risk for  type 2 diabetes, heart disease, and stroke.  With the DASH eating plan, you should limit salt (sodium) intake to 2,300 mg a day. If you have hypertension, you may need to reduce your sodium intake to 1,500 mg a day.  When on the DASH eating plan, aim to eat more fresh fruits and vegetables, whole grains, lean proteins, low-fat dairy, and heart-healthy fats.  Work with your health care provider or diet and nutrition specialist (dietitian) to adjust your eating plan to your individual calorie needs. This information is not intended to replace advice given to you by your health care provider. Make sure you discuss any  questions you have with your health care provider. Document Released: 05/22/2011 Document Revised: 05/26/2016 Document Reviewed: 05/26/2016 Elsevier Interactive Patient Education  2017 [place] Maintenance, Female Adopting a healthy lifestyle and getting preventive care can go a long way to promote health and wellness. Talk with your health care provider about what schedule of regular examinations is right for you. This is a good chance for you to check in with your provider about disease prevention and staying healthy. In between checkups, there are plenty of things you can do on your own. Experts have done a lot of research about which lifestyle changes and preventive measures are most likely to keep you healthy. Ask your health care provider for more information. Weight and diet Eat a healthy diet  Be sure to include plenty of vegetables, fruits, low-fat dairy products, and lean protein.  Do not eat a lot of foods high in solid fats, added sugars, or salt.  Get regular exercise. This is one of the most important things you can do for your health.  Most adults should exercise for at least 150 minutes each week. The exercise should increase your heart rate and make you sweat (moderate-intensity exercise).  Most adults should also do strengthening exercises at least twice a week. This is in addition to the moderate-intensity exercise. Maintain a healthy weight  Body mass index (BMI) is a measurement that can be used to identify possible weight problems. It estimates body fat based on height and weight. Your health care provider can help determine your BMI and help you achieve or maintain a healthy weight.  For females 87 years of age and older:  A BMI below 18.5 is considered underweight.  A BMI of 18.5 to 24.9 is normal.  A BMI of 25 to 29.9 is considered overweight.  A BMI of 30 and above is considered obese. Watch levels of cholesterol and blood lipids  You should  start having your blood tested for lipids and cholesterol at 61 years of age, then have this test every 5 years.  You may need to have your cholesterol levels checked more often if:  Your lipid or cholesterol levels are high.  You are older than 61 years of age.  You are at high risk for heart disease. Cancer screening Lung Cancer  Lung cancer screening is recommended for adults 74-68 years old who are at high risk for lung cancer because of a history of smoking.  A yearly low-dose CT scan of the lungs is recommended for people who:  Currently smoke.  Have quit within the past 15 years.  Have at least a 30-pack-year history of smoking. A pack year is smoking an average of one pack of cigarettes a day for 1 year.  Yearly screening should continue until it has been 15 years since you quit.  Yearly screening should  stop if you develop a health problem that would prevent you from having lung cancer treatment. Breast Cancer  Practice breast self-awareness. This means understanding how your breasts normally appear and feel.  It also means doing regular breast self-exams. Let your health care provider know about any changes, no matter how small.  If you are in your 20s or 30s, you should have a clinical breast exam (CBE) by a health care provider every 1-3 years as part of a regular health exam.  If you are 36 or older, have a CBE every year. Also consider having a breast X-ray (mammogram) every year.  If you have a family history of breast cancer, talk to your health care provider about genetic screening.  If you are at high risk for breast cancer, talk to your health care provider about having an MRI and a mammogram every year.  Breast cancer gene (BRCA) assessment is recommended for women who have family members with BRCA-related cancers. BRCA-related cancers include:  Breast.  Ovarian.  Tubal.  Peritoneal cancers.  Results of the assessment will determine the need for  genetic counseling and BRCA1 and BRCA2 testing. Cervical Cancer  Your health care provider may recommend that you be screened regularly for cancer of the pelvic organs (ovaries, uterus, and vagina). This screening involves a pelvic examination, including checking for microscopic changes to the surface of your cervix (Pap test). You may be encouraged to have this screening done every 3 years, beginning at age 37.  For women ages 4-65, health care providers may recommend pelvic exams and Pap testing every 3 years, or they may recommend the Pap and pelvic exam, combined with testing for human papilloma virus (HPV), every 5 years. Some types of HPV increase your risk of cervical cancer. Testing for HPV may also be done on women of any age with unclear Pap test results.  Other health care providers may not recommend any screening for nonpregnant women who are considered low risk for pelvic cancer and who do not have symptoms. Ask your health care provider if a screening pelvic exam is right for you.  If you have had past treatment for cervical cancer or a condition that could lead to cancer, you need Pap tests and screening for cancer for at least 20 years after your treatment. If Pap tests have been discontinued, your risk factors (such as having a new sexual partner) need to be reassessed to determine if screening should resume. Some women have medical problems that increase the chance of getting cervical cancer. In these cases, your health care provider may recommend more frequent screening and Pap tests. Colorectal Cancer  This type of cancer can be detected and often prevented.  Routine colorectal cancer screening usually begins at 61 years of age and continues through 61 years of age.  Your health care provider may recommend screening at an earlier age if you have risk factors for colon cancer.  Your health care provider may also recommend using home test kits to check for hidden blood in the  stool.  A small camera at the end of a tube can be used to examine your colon directly (sigmoidoscopy or colonoscopy). This is done to check for the earliest forms of colorectal cancer.  Routine screening usually begins at age 79.  Direct examination of the colon should be repeated every 5-10 years through 61 years of age. However, you may need to be screened more often if early forms of precancerous polyps or small growths are  found. Skin Cancer  Check your skin from head to toe regularly.  Tell your health care provider about any new moles or changes in moles, especially if there is a change in a mole's shape or color.  Also tell your health care provider if you have a mole that is larger than the size of a pencil eraser.  Always use sunscreen. Apply sunscreen liberally and repeatedly throughout the day.  Protect yourself by wearing long sleeves, pants, a wide-brimmed hat, and sunglasses whenever you are outside. Heart disease, diabetes, and high blood pressure  High blood pressure causes heart disease and increases the risk of stroke. High blood pressure is more likely to develop in:  People who have blood pressure in the high end of the normal range (130-139/85-89 mm Hg).  People who are overweight or obese.  People who are African American.  If you are 8-40 years of age, have your blood pressure checked every 3-5 years. If you are 46 years of age or older, have your blood pressure checked every year. You should have your blood pressure measured twice-once when you are at a hospital or clinic, and once when you are not at a hospital or clinic. Record the average of the two measurements. To check your blood pressure when you are not at a hospital or clinic, you can use:  An automated blood pressure machine at a pharmacy.  A home blood pressure monitor.  If you are between 30 years and 60 years old, ask your health care provider if you should take aspirin to prevent  strokes.  Have regular diabetes screenings. This involves taking a blood sample to check your fasting blood sugar level.  If you are at a normal weight and have a low risk for diabetes, have this test once every three years after 61 years of age.  If you are overweight and have a high risk for diabetes, consider being tested at a younger age or more often. Preventing infection Hepatitis B  If you have a higher risk for hepatitis B, you should be screened for this virus. You are considered at high risk for hepatitis B if:  You were born in a country where hepatitis B is common. Ask your health care provider which countries are considered high risk.  Your parents were born in a high-risk country, and you have not been immunized against hepatitis B (hepatitis B vaccine).  You have HIV or AIDS.  You use needles to inject street drugs.  You live with someone who has hepatitis B.  You have had sex with someone who has hepatitis B.  You get hemodialysis treatment.  You take certain medicines for conditions, including cancer, organ transplantation, and autoimmune conditions. Hepatitis C  Blood testing is recommended for:  Everyone born from [birth year] through 1965.  Anyone with known risk factors for hepatitis C. Sexually transmitted infections (STIs)  You should be screened for sexually transmitted infections (STIs) including gonorrhea and chlamydia if:  You are sexually active and are younger than 61 years of age.  You are older than 61 years of age and your health care provider tells you that you are at risk for this type of infection.  Your sexual activity has changed since you were last screened and you are at an increased risk for chlamydia or gonorrhea. Ask your health care provider if you are at risk.  If you do not have HIV, but are at risk, it may be recommended that you take a prescription  medicine daily to prevent HIV infection. This is called pre-exposure prophylaxis  (PrEP). You are considered at risk if:  You are sexually active and do not regularly use condoms or know the HIV status of your partner(s).  You take drugs by injection.  You are sexually active with a partner who has HIV. Talk with your health care provider about whether you are at high risk of being infected with HIV. If you choose to begin PrEP, you should first be tested for HIV. You should then be tested every 3 months for as long as you are taking PrEP. Pregnancy  If you are premenopausal and you may become pregnant, ask your health care provider about preconception counseling.  If you may become pregnant, take 400 to 800 micrograms (mcg) of folic acid every day.  If you want to prevent pregnancy, talk to your health care provider about birth control (contraception). Osteoporosis and menopause  Osteoporosis is a disease in which the bones lose minerals and strength with aging. This can result in serious bone fractures. Your risk for osteoporosis can be identified using a bone density scan.  If you are 28 years of age or older, or if you are at risk for osteoporosis and fractures, ask your health care provider if you should be screened.  Ask your health care provider whether you should take a calcium or vitamin D supplement to lower your risk for osteoporosis.  Menopause may have certain physical symptoms and risks.  Hormone replacement therapy may reduce some of these symptoms and risks. Talk to your health care provider about whether hormone replacement therapy is right for you. Follow these instructions at home:  Schedule regular health, dental, and eye exams.  Stay current with your immunizations.  Do not use any tobacco products including cigarettes, chewing tobacco, or electronic cigarettes.  If you are pregnant, do not drink alcohol.  If you are breastfeeding, limit how much and how often you drink alcohol.  Limit alcohol intake to no more than 1 drink per day for  nonpregnant women. One drink equals 12 ounces of beer, 5 ounces of wine, or 1 ounces of hard liquor.  Do not use street drugs.  Do not share needles.  Ask your health care provider for help if you need support or information about quitting drugs.  Tell your health care provider if you often feel depressed.  Tell your health care provider if you have ever been abused or do not feel safe at home. This information is not intended to replace advice given to you by your health care provider. Make sure you discuss any questions you have with your health care provider. Document Released: 12/16/2010 Document Revised: 11/08/2015 Document Reviewed: 03/06/2015 Elsevier Interactive Patient Education  2017 [employer].

## 2016-09-23 ENCOUNTER — Telehealth: Payer: Self-pay | Admitting: Internal Medicine

## 2016-09-23 ENCOUNTER — Other Ambulatory Visit: Payer: Self-pay | Admitting: Emergency Medicine

## 2016-09-23 DIAGNOSIS — Z1211 Encounter for screening for malignant neoplasm of colon: Secondary | ICD-10-CM

## 2016-09-23 DIAGNOSIS — Z1212 Encounter for screening for malignant neoplasm of rectum: Secondary | ICD-10-CM

## 2016-09-23 NOTE — Telephone Encounter (Signed)
Spoke with pt and asked her if she could come to the office to sign off on the cologuard document. She agreed paper work will be waiting up front

## 2016-09-23 NOTE — Telephone Encounter (Signed)
° ° ° °  Pt call to say she spoke with her insurance company and they will cover Colon Guard test and she would like  to proceed

## 2016-09-30 DIAGNOSIS — M5137 Other intervertebral disc degeneration, lumbosacral region: Secondary | ICD-10-CM | POA: Diagnosis not present

## 2016-09-30 DIAGNOSIS — M9903 Segmental and somatic dysfunction of lumbar region: Secondary | ICD-10-CM | POA: Diagnosis not present

## 2016-09-30 DIAGNOSIS — M5136 Other intervertebral disc degeneration, lumbar region: Secondary | ICD-10-CM | POA: Diagnosis not present

## 2016-10-03 ENCOUNTER — Telehealth: Payer: Self-pay | Admitting: Internal Medicine

## 2016-10-03 ENCOUNTER — Telehealth: Payer: Self-pay | Admitting: Emergency Medicine

## 2016-10-03 NOTE — Telephone Encounter (Signed)
Cologuard faxed nothing further needed

## 2016-10-03 NOTE — Telephone Encounter (Signed)
° ° ° °  Pt husband DOB  [birth year]   His name is Christia Reading    For colo guard test

## 2016-10-03 NOTE — Telephone Encounter (Signed)
Left voicemail for pt to give the office a call back regarding Cologuard referral

## 2016-10-07 DIAGNOSIS — Z1212 Encounter for screening for malignant neoplasm of rectum: Secondary | ICD-10-CM | POA: Diagnosis not present

## 2016-10-07 DIAGNOSIS — Z1211 Encounter for screening for malignant neoplasm of colon: Secondary | ICD-10-CM | POA: Diagnosis not present

## 2016-10-07 LAB — COLOGUARD

## 2016-10-13 DIAGNOSIS — L03119 Cellulitis of unspecified part of limb: Secondary | ICD-10-CM | POA: Diagnosis not present

## 2016-10-13 LAB — COLOGUARD: COLOGUARD: POSITIVE

## 2016-10-14 ENCOUNTER — Encounter: Payer: Self-pay | Admitting: Family Medicine

## 2016-10-22 ENCOUNTER — Telehealth: Payer: Self-pay | Admitting: Internal Medicine

## 2016-10-22 DIAGNOSIS — R195 Other fecal abnormalities: Secondary | ICD-10-CM

## 2016-10-22 NOTE — Telephone Encounter (Signed)
Dana Bailey pt returned your call

## 2016-10-23 ENCOUNTER — Encounter: Payer: Self-pay | Admitting: Internal Medicine

## 2016-10-23 NOTE — Telephone Encounter (Signed)
Spoke with pt to inform her of the cologuard results being positive. Pt is okay with putting in referral for GI

## 2016-11-07 NOTE — Telephone Encounter (Signed)
Dana Bailey please add  cephelin to her allergy list  With sx as she requests.  thanks

## 2016-11-11 DIAGNOSIS — M5136 Other intervertebral disc degeneration, lumbar region: Secondary | ICD-10-CM | POA: Diagnosis not present

## 2016-11-11 DIAGNOSIS — M9903 Segmental and somatic dysfunction of lumbar region: Secondary | ICD-10-CM | POA: Diagnosis not present

## 2016-11-11 DIAGNOSIS — M5137 Other intervertebral disc degeneration, lumbosacral region: Secondary | ICD-10-CM | POA: Diagnosis not present

## 2016-11-27 ENCOUNTER — Ambulatory Visit: Payer: 59

## 2016-11-27 ENCOUNTER — Encounter: Payer: Self-pay | Admitting: Internal Medicine

## 2016-11-27 VITALS — Ht 62.5 in | Wt 180.6 lb

## 2016-11-27 DIAGNOSIS — R195 Other fecal abnormalities: Secondary | ICD-10-CM

## 2016-11-27 MED ORDER — SUPREP BOWEL PREP KIT 17.5-3.13-1.6 GM/177ML PO SOLN: 1.0000 | Freq: Once | ORAL | 0 refills | Status: AC

## 2016-12-10 ENCOUNTER — Encounter: Payer: Self-pay | Admitting: Internal Medicine

## 2016-12-10 ENCOUNTER — Ambulatory Visit (AMBULATORY_SURGERY_CENTER): Payer: 59 | Admitting: Internal Medicine

## 2016-12-10 VITALS — BP 139/81 | HR 68 | Temp 97.7°F | Resp 11 | Ht 62.5 in | Wt 180.0 lb

## 2016-12-10 DIAGNOSIS — R195 Other fecal abnormalities: Secondary | ICD-10-CM | POA: Diagnosis present

## 2016-12-10 DIAGNOSIS — D125 Benign neoplasm of sigmoid colon: Secondary | ICD-10-CM

## 2016-12-10 DIAGNOSIS — D120 Benign neoplasm of cecum: Secondary | ICD-10-CM | POA: Diagnosis not present

## 2016-12-10 DIAGNOSIS — Z1211 Encounter for screening for malignant neoplasm of colon: Secondary | ICD-10-CM | POA: Diagnosis not present

## 2016-12-10 DIAGNOSIS — D122 Benign neoplasm of ascending colon: Secondary | ICD-10-CM

## 2016-12-10 DIAGNOSIS — D123 Benign neoplasm of transverse colon: Secondary | ICD-10-CM

## 2016-12-10 MED ORDER — SODIUM CHLORIDE 0.9 % IV SOLN: 500.0000 mL | INTRAVENOUS | Status: AC

## 2016-12-10 NOTE — Progress Notes (Signed)
Called to room to assist during endoscopic procedure.  Patient ID and intended procedure confirmed with present staff. Received instructions for my participation in the procedure from the performing physician.  

## 2016-12-10 NOTE — Patient Instructions (Signed)
YOU HAD AN ENDOSCOPIC PROCEDURE TODAY AT THE [facility]:   Refer to the procedure report that was given to you for any specific questions about what was found during the examination.  If the procedure report does not answer your questions, please call your gastroenterologist to clarify.  If you requested that your care partner not be given the details of your procedure findings, then the procedure report has been included in a sealed envelope for you to review at your convenience later.  YOU SHOULD EXPECT: Some feelings of bloating in the abdomen. Passage of more gas than usual.  Walking can help get rid of the air that was put into your GI tract during the procedure and reduce the bloating. If you had a lower endoscopy (such as a colonoscopy or flexible sigmoidoscopy) you may notice spotting of blood in your stool or on the toilet paper. If you underwent a bowel prep for your procedure, you may not have a normal bowel movement for a few days.  Please Note:  You might notice some irritation and congestion in your nose or some drainage.  This is from the oxygen used during your procedure.  There is no need for concern and it should clear up in a day or so.  SYMPTOMS TO REPORT IMMEDIATELY:   Following lower endoscopy (colonoscopy or flexible sigmoidoscopy):  Excessive amounts of blood in the stool  Significant tenderness or worsening of abdominal pains  Swelling of the abdomen that is new, acute  Fever of 100F or higher   For urgent or emergent issues, a gastroenterologist can be reached at any hour by calling [phone].   DIET:  We do recommend a small meal at first, but then you may proceed to your regular diet.  Drink plenty of fluids but you should avoid alcoholic beverages for 24 hours.  ACTIVITY:  You should plan to take it easy for the rest of today and you should NOT DRIVE or use heavy machinery until tomorrow (because of the sedation medicines used during the test).     FOLLOW UP: Our staff will call the number listed on your records the next business day following your procedure to check on you and address any questions or concerns that you may have regarding the information given to you following your procedure. If we do not reach you, we will leave a message.  However, if you are feeling well and you are not experiencing any problems, there is no need to return our call.  We will assume that you have returned to your regular daily activities without incident.  If any biopsies were taken you will be contacted by phone or by letter within the next 1-3 weeks.  Please call us at [phone] if you have not heard about the biopsies in 3 weeks.    SIGNATURES/CONFIDENTIALITY: You and/or your care partner have signed paperwork which will be entered into your electronic medical record.  These signatures attest to the fact that that the information above on your After Visit Summary has been reviewed and is understood.  Full responsibility of the confidentiality of this discharge information lies with you and/or your care-partner.   Resume medications. Information given on polyps,diverticulosis and hemorrhoids. 

## 2016-12-10 NOTE — Progress Notes (Signed)
Pt's states no medical or surgical changes since previsit or office visit. 

## 2016-12-10 NOTE — Progress Notes (Signed)
A and O x3. Report to RN. Tolerated MAC anesthesia well.

## 2016-12-10 NOTE — Op Note (Signed)
[facility] Patient Name: Dana Bailey Procedure Date: 12/10/2016 11:56 AM MRN: [id number] Endoscopist: Jerene Bears , MD Age: 61 Referring MD:  Date of Birth: [birth year] Gender: Female Account #: [id number] Procedure:                Colonoscopy Indications:              Positive Cologuard test Medicines:                Monitored Anesthesia Care Procedure:                Pre-Anesthesia Assessment:                           - Prior to the procedure, a History and Physical                            was performed, and patient medications and                            allergies were reviewed. The patient's tolerance of                            previous anesthesia was also reviewed. The risks                            and benefits of the procedure and the sedation                            options and risks were discussed with the patient.                            All questions were answered, and informed consent                            was obtained. Prior Anticoagulants: The patient has                            taken no previous anticoagulant or antiplatelet                            agents. ASA Grade Assessment: II - A patient with                            mild systemic disease. After reviewing the risks                            and benefits, the patient was deemed in                            satisfactory condition to undergo the procedure.                           After obtaining informed consent, the colonoscope  was passed under direct vision. Throughout the                            procedure, the patient's blood pressure, pulse, and                            oxygen saturations were monitored continuously. The                            Model PCF-[id number]) scope was introduced                            through the anus and advanced to the the cecum,                            identified by appendiceal orifice and  ileocecal                            valve. The colonoscopy was performed without                            difficulty. The patient tolerated the procedure                            well. The quality of the bowel preparation was                            good. The ileocecal valve, appendiceal orifice, and                            rectum were photographed. Scope In: 12:15:10 PM Scope Out: 12:32:46 PM Scope Withdrawal Time: 0 hours 14 minutes 1 second  Total Procedure Duration: 0 hours 17 minutes 36 seconds  Findings:                 The digital rectal exam was normal.                           A 5 mm polyp was found in the cecum. The polyp was                            sessile. The polyp was removed with a cold snare.                            Resection and retrieval were complete.                           A 3 mm polyp was found in the ascending colon. The                            polyp was sessile. The polyp was removed with a                            cold  snare. Resection and retrieval were complete.                           A 7 mm polyp was found in the hepatic flexure. The                            polyp was sessile. The polyp was removed with a                            cold snare. Resection and retrieval were complete.                           A 6 mm polyp was found in the sigmoid colon. The                            polyp was sessile. The polyp was removed with a                            cold snare. Resection and retrieval were complete.                           Multiple small-mouthed diverticula were found in                            the sigmoid colon.                           Internal hemorrhoids were found during                            retroflexion. The hemorrhoids were small. Complications:            No immediate complications. Estimated Blood Loss:     Estimated blood loss was minimal. Impression:               - One 5 mm polyp in the cecum, removed  with a cold                            snare. Resected and retrieved.                           - One 3 mm polyp in the ascending colon, removed                            with a cold snare. Resected and retrieved.                           - One 7 mm polyp at the hepatic flexure, removed                            with a cold snare. Resected and retrieved.                           - One 6  mm polyp in the sigmoid colon, removed with                            a cold snare. Resected and retrieved.                           - Diverticulosis in the sigmoid colon.                           - Internal hemorrhoids. Recommendation:           - Patient has a contact number available for                            emergencies. The signs and symptoms of potential                            delayed complications were discussed with the                            patient. Return to normal activities tomorrow.                            Written discharge instructions were provided to the                            patient.                           - Resume previous diet.                           - Continue present medications.                           - Await pathology results.                           - Repeat colonoscopy is recommended. The                            colonoscopy date will be determined after pathology                            results from today's exam become available for                            review. Jerene Bears, MD 12/10/2016 12:36:09 PM This report has been signed electronically.

## 2016-12-11 ENCOUNTER — Telehealth: Payer: Self-pay

## 2016-12-11 NOTE — Telephone Encounter (Signed)
  Follow up Call-  Call back number 12/10/2016  Post procedure Call Back phone  # [phone]  Permission to leave phone message Yes  Some recent data might be hidden     Patient questions:  Do you have a fever, pain , or abdominal swelling? No. Pain Score  0 *  Have you tolerated food without any problems? Yes.    Have you been able to return to your normal activities? Yes.    Do you have any questions about your discharge instructions: Diet   No. Medications  No. Follow up visit  No.  Do you have questions or concerns about your Care? No.  Actions: * If pain score is 4 or above: No action needed, pain <4.  No problems noted per pt. maw

## 2016-12-19 ENCOUNTER — Encounter: Payer: Self-pay | Admitting: Internal Medicine

## 2016-12-23 DIAGNOSIS — M5137 Other intervertebral disc degeneration, lumbosacral region: Secondary | ICD-10-CM | POA: Diagnosis not present

## 2016-12-23 DIAGNOSIS — M5136 Other intervertebral disc degeneration, lumbar region: Secondary | ICD-10-CM | POA: Diagnosis not present

## 2016-12-23 DIAGNOSIS — M9903 Segmental and somatic dysfunction of lumbar region: Secondary | ICD-10-CM | POA: Diagnosis not present

## 2017-03-03 DIAGNOSIS — M9903 Segmental and somatic dysfunction of lumbar region: Secondary | ICD-10-CM | POA: Diagnosis not present

## 2017-03-03 DIAGNOSIS — M5137 Other intervertebral disc degeneration, lumbosacral region: Secondary | ICD-10-CM | POA: Diagnosis not present

## 2017-03-03 DIAGNOSIS — M5136 Other intervertebral disc degeneration, lumbar region: Secondary | ICD-10-CM | POA: Diagnosis not present

## 2017-03-06 ENCOUNTER — Encounter: Payer: Self-pay | Admitting: Internal Medicine

## 2017-03-31 DIAGNOSIS — M9903 Segmental and somatic dysfunction of lumbar region: Secondary | ICD-10-CM | POA: Diagnosis not present

## 2017-03-31 DIAGNOSIS — M5137 Other intervertebral disc degeneration, lumbosacral region: Secondary | ICD-10-CM | POA: Diagnosis not present

## 2017-03-31 DIAGNOSIS — M5136 Other intervertebral disc degeneration, lumbar region: Secondary | ICD-10-CM | POA: Diagnosis not present

## 2017-05-28 DIAGNOSIS — M5136 Other intervertebral disc degeneration, lumbar region: Secondary | ICD-10-CM | POA: Diagnosis not present

## 2017-05-28 DIAGNOSIS — M9903 Segmental and somatic dysfunction of lumbar region: Secondary | ICD-10-CM | POA: Diagnosis not present

## 2017-05-28 DIAGNOSIS — M5137 Other intervertebral disc degeneration, lumbosacral region: Secondary | ICD-10-CM | POA: Diagnosis not present

## 2017-07-21 DIAGNOSIS — M9903 Segmental and somatic dysfunction of lumbar region: Secondary | ICD-10-CM | POA: Diagnosis not present

## 2017-07-21 DIAGNOSIS — M5137 Other intervertebral disc degeneration, lumbosacral region: Secondary | ICD-10-CM | POA: Diagnosis not present

## 2017-07-21 DIAGNOSIS — M5136 Other intervertebral disc degeneration, lumbar region: Secondary | ICD-10-CM | POA: Diagnosis not present

## 2017-10-27 ENCOUNTER — Encounter: Payer: 59 | Admitting: Internal Medicine

## 2017-12-04 DIAGNOSIS — S0121XA Laceration without foreign body of nose, initial encounter: Secondary | ICD-10-CM | POA: Diagnosis not present

## 2017-12-04 DIAGNOSIS — W228XXA Striking against or struck by other objects, initial encounter: Secondary | ICD-10-CM | POA: Diagnosis not present

## 2017-12-04 DIAGNOSIS — S60811A Abrasion of right wrist, initial encounter: Secondary | ICD-10-CM | POA: Diagnosis not present

## 2017-12-10 ENCOUNTER — Encounter: Payer: Self-pay | Admitting: Family

## 2017-12-10 ENCOUNTER — Ambulatory Visit (INDEPENDENT_AMBULATORY_CARE_PROVIDER_SITE_OTHER)
Admission: RE | Admit: 2017-12-10 | Discharge: 2017-12-10 | Disposition: A | Discharge status: Home / Self Care | Payer: 59 | Source: Ambulatory Visit | Attending: Family | Admitting: Family

## 2017-12-10 ENCOUNTER — Ambulatory Visit: Payer: 59 | Admitting: Family

## 2017-12-10 VITALS — BP 128/82 | HR 67 | Temp 98.2°F | Ht 62.5 in | Wt 180.1 lb

## 2017-12-10 DIAGNOSIS — M25531 Pain in right wrist: Secondary | ICD-10-CM | POA: Diagnosis not present

## 2017-12-10 NOTE — Progress Notes (Signed)
Dana Bailey is a 62 y.o. female with the following history as recorded in [employer]:  Patient Active Problem List   Diagnosis Date Noted  . Renal stone 01/03/2013  . Gallstones 01/03/2013  . Need for hepatitis A and B vaccination 08/02/2012  . Transfusion history   . BMI 33.0-33.9,adult 07/07/2012  . Bunion, right foot 07/07/2012  . Foot lesion 05/14/2012  . 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 BMI 34.0-34.9,adult 05/14/2012  . ASD (atrial septal defect) 05/10/2012  . Elevated blood pressure 05/10/2012  . H/O congenital heart disease 05/10/2012  . Preventive measure 05/10/2012  . History of renal stone 05/10/2012  . Perennial allergic rhinitis 05/10/2012  . Family history of cardiovascular disorder 05/10/2012    Current Outpatient Medications  Medication Sig Dispense Refill  . AMOXICILLIN PO Take 2,000 mg by mouth. 2000mg  po x 1 prior to denal procedures    . fexofenadine (ALLEGRA) 180 MG tablet Take 180 mg by mouth daily.    . Naproxen Sodium (ALEVE PO) Take by mouth.    . sodium chloride (OCEAN) 0.65 % SOLN nasal spray Place 1 spray into both nostrils as needed for congestion.     Current Facility-Administered Medications  Medication Dose Route Frequency Provider Last Rate Last Dose  . 0.9 %  sodium chloride infusion  500 mL Intravenous Continuous Pyrtle, Lajuan Lines, MD        Allergies: Cephalexin; Other; Septra [sulfamethoxazole-trimethoprim]; and Sulfa antibiotics  Past Medical History:  Diagnosis Date  . Allergy    dust,pollen,ragweed,oak,pine  . Chicken pox   . Congenital heart defect   . Hay fever   . Hepatitis     hepatitis C from blood transfusion  . 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 High blood pressure    white coat syndrome  . Hx of  echocardiogram 2013   nl except 2 diastolic dysfunction nl lv   no pht,no asd  . Hx of heart surgery    asd closure  . Kidney stones 1062,6948  . Need for rhogam due to Rh negative mother    x 2 1982 and 1986  . Shingles 12/2011  . Transfusion history 1974   4 units  1 from father  heart surgery    Past Surgical History:  Procedure Laterality Date  . ASD REPAIR  1974   Age 57  . KIDNEY STONE SURGERY     right kidney  . LITHOTRIPSY  2013  . OVARIAN CYST REMOVAL  1998   Laparoscopic 1998  . TONSILLECTOMY AND ADENOIDECTOMY  1962    Family History  Problem Relation Age of Onset  . Arthritis Mother        Degenerative disc disease  . Hyperlipidemia Mother   . Hypertension Mother   . Kidney disease Mother   . Mental illness Mother   . Arthritis Father   . Hyperlipidemia Father   . Heart disease Father   . Stroke Father   . Hypertension Father   . Diabetes Father   . Alzheimer's disease Father   . Glaucoma Unknown   . Macular degeneration Unknown   . Breast cancer Sister   . Colon cancer Neg Hx     Social History  Tobacco Use  . Smoking status: Never Smoker  . Smokeless tobacco: Never Used  Substance Use Topics  . Alcohol use: Yes    Comment: rarely    Subjective:  Patient presents to have 2 stitches removed; feel while on vacation and had to be seen at [facility]; required 2 stitches on her bridge of nose; still having prominent swelling/ bruising in right wrist;    Objective:  Vitals:   12/10/17 1340  BP: 128/82  Pulse: 67  Temp: 98.2 F (36.8 C)  TempSrc: Oral  SpO2: 99%  Weight: 180 lb 1.3 oz (81.7 kg)  Height: 5' 2.5" (1.588 m)    General: Well developed, well nourished, in no acute distress  Skin : Warm and dry. Healed lesion on bridge of nose- 2 stitches removed with no complication; wound is well healed with no signs of infection;  Head: Normocephalic and atraumatic  Lungs: Respirations unlabored; clear to auscultation bilaterally without wheeze, rales,  rhonchi  Musculoskeletal: No deformities; swelling/ bruising noted over right wrist Neurologic: Alert and oriented; speech intact; face symmetrical; moves all extremities well; CNII-XII intact without focal deficit  Assessment:  1. Right wrist pain     Plan:  Update wrist X-ray follow-up to be determined;  Stitches removed with no difficulty- keep area covered with Aquaphor/ Mederma or Vitamin E oil to help minimize scarring; She will follow-up with her PCP as needed otherwise.    No follow-ups on file.  Orders Placed This Encounter  Procedures  . DG Wrist Complete Right    Standing Status:   Future    Number of Occurrences:   1    Standing Expiration Date:   02/10/2019    Order Specific Question:   Reason for Exam (SYMPTOM  OR DIAGNOSIS REQUIRED)    Answer:   pain/ swelling right wrist    Order Specific Question:   Preferred imaging location?    Answer:   [facility]    Order Specific Question:   Radiology Contrast Protocol - do NOT remove file path    Answer:   [email or web address]    Requested Prescriptions    No prescriptions requested or ordered in this encounter

## 2018-01-25 NOTE — Progress Notes (Signed)
Chief Complaint  Patient presents with  . Annual Exam    no new concerns    HPI: Patient  Dana Bailey  62 y.o. comes in today for Preventive Health Care visit   130 range /70 .   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     Health Maintenance  Topic Date Due  . PAP SMEAR  07/08/2015  . INFLUENZA VACCINE  01/14/2018  . MAMMOGRAM  08/27/2018  . Fecal DNA (Cologuard)  10/14/2019  . TETANUS/TDAP  09/18/2021  . Hepatitis C Screening  Completed  . HIV Screening  Completed   Health Maintenance Review LIFESTYLE:  Exercise:   Walks q d .  Tobacco/ETS:    no Alcohol:   Once q 2 weeks  Sugar beverages: no Sleep: usually 6   Drug use: no HH of   2 1 dog  Work: 42 hours     ROS:  seom snoring no osa sx  GEN/ HEENT: No fever, significant weight changes sweats headaches vision problems hearing changes, CV/ PULM; No chest pain shortness of breath cough, syncope,edema  change in exercise tolerance. GI /GU: No adominal pain, vomiting, change in bowel habits. No blood in the stool. No significant GU symptoms. SKIN/HEME: ,no acute skin rashes suspicious lesions or bleeding. No lymphadenopathy, nodules, masses.  NEURO/ PSYCH:  No neurologic signs such as weakness numbness. No depression anxiety. IMM/ Allergy: No unusual infections.  Allergy .   REST of 12 system review negative except as per HPI   Past Medical History:  Diagnosis Date  . Allergy    dust,pollen,ragweed,oak,pine  . Chicken pox   . Congenital heart defect   . Hay fever   . Hepatitis     hepatitis C from blood transfusion  . 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  High blood pressure    white coat syndrome  . Hx of echocardiogram 2013   nl except 2 diastolic dysfunction nl lv   no pht,no asd  . Hx of heart surgery    asd closure  . Kidney stones 2376,2831  . Need for rhogam due to Rh negative mother    x 2 1982 and 1986  . Shingles 12/2011  . Transfusion history 1974   4 units  1 from father  heart surgery    Past Surgical History:  Procedure Laterality Date  . ASD REPAIR  1974   Age 39  . KIDNEY STONE SURGERY     right kidney  . LITHOTRIPSY  2013  . OVARIAN CYST REMOVAL  1998   Laparoscopic 1998  . TONSILLECTOMY AND ADENOIDECTOMY  1962    Family History  Problem Relation Age of Onset  . Arthritis Mother        Degenerative disc disease  . Hyperlipidemia Mother   . Hypertension Mother   . Kidney disease Mother   . Mental illness Mother   . Arthritis Father   . Hyperlipidemia Father   . Heart disease Father   . Stroke Father   . Hypertension Father   .  Diabetes Father   . Alzheimer's disease Father   . Glaucoma Unknown   . Macular degeneration Unknown   . Breast cancer Sister   . Colon cancer Neg Hx     Social History   Socioeconomic History  . Marital status: Married    Spouse name: Not on file  . Number of children: Not on file  . Years of education: Not on file  . Highest education level: Not on file  Occupational History  . Not on file  Social Needs  . Financial resource strain: Not on file  . Food insecurity:    Worry: Not on file    Inability: Not on file  . Transportation needs:    Medical: Not on file    Non-medical: Not on file  Tobacco Use  . Smoking status: Never Smoker  . Smokeless tobacco: Never Used  Substance and Sexual Activity  . Alcohol use: Yes    Comment: rarely  . Drug use: No  . Sexual activity: Not on file  Lifestyle  . Physical activity:    Days per week: Not on file    Minutes per session: Not on file  . Stress: Not on file  Relationships  . Social connections:    Talks on  phone: Not on file    Gets together: Not on file    Attends religious service: Not on file    Active member of club or organization: Not on file    Attends meetings of clubs or organizations: Not on file    Relationship status: Not on file  Other Topics Concern  . Not on file  Social History Narrative   Household of 2 married   PET your T2    BS in math  [employer]  born [place]   In [place] in [place] 2 para 2 [phone]   Negative TAD seatbelts at a smoke alarm.   Last Pap 6 2007 last period 3 2000 and 5T depth for 5 2013 no recent mammogram no colonoscopy    Outpatient Medications Prior to Visit  Medication Sig Dispense Refill  . AMOXICILLIN PO Take 2,000 mg by mouth. '2000mg'$  po x 1 prior to denal procedures    . fexofenadine (ALLEGRA) 180 MG tablet Take 180 mg by mouth daily.    . Naproxen Sodium (ALEVE PO) Take by mouth.    . sodium chloride (OCEAN) 0.65 % SOLN nasal spray Place 1 spray into both nostrils as needed for congestion.     Facility-Administered Medications Prior to Visit  Medication Dose Route Frequency Provider Last Rate Last Dose  . 0.9 %  sodium chloride infusion  500 mL Intravenous Continuous Pyrtle, Lajuan Lines, MD         EXAM:  BP (!) 160/80 (BP Location: Right Arm, Cuff Size: Normal)   Pulse 83   Temp 98.1 F (36.7 C) (Oral)   Ht 5' 2.5" (1.588 m)   Wt 180 lb 4.8 oz (81.8 kg)   SpO2 97%   BMI 32.45 kg/m   Body mass index is 32.45 kg/m. Wt Readings from Last 3 Encounters:  01/26/18 180 lb 4.8 oz (81.8 kg)  12/10/17 180 lb 1.3 oz (81.7 kg)  12/10/16 180 lb (81.6 kg)    Physical Exam: Vital signs reviewed POE:UMPN is a well-developed well-nourished alert cooperative    who appearsr stated age in no acute distress.  HEENT: normocephalic atraumatic , Eyes: PERRL EOM's full, conjunctiva clear,  Nares: paten,t no deformity discharge or tenderness., Ears: no deformity EAC's clear TMs with normal  landmarks. Mouth: clear OP, no lesions, edema.  Moist mucous membranes. Dentition in adequate repair. NECK: supple without masses, thyromegaly or bruits. CHEST/PULM:  Clear to auscultation and percussion breath sounds equal no wheeze , rales or rhonchi. No chest wall deformities or tenderness. Breast: normal by inspection . No dimpling, discharge, masses, tenderness or discharge . CV: PMI is nondisplaced, S1 S2 no gallops, murmurs, rubs. Peripheral pulses are full without delay.No JVD .  ABDOMEN: Bowel sounds normal nontender  No guard or rebound, no hepato splenomegal no CVA tenderness.  . Extremtities:  No clubbing cyanosis or edema, no acute joint swelling or redness no focal atrophy NEURO:  Oriented x3, cranial nerves 3-12 appear to be intact, no obvious focal weakness,gait within normal limits no abnormal reflexes or asymmetrical SKIN: No acute rashes normal turgor, color, no bruising or petechiae. Healed scar on bridge of nose  PSYCH: Oriented, good eye contact, no obvious depression anxiety, cognition and judgment appear normal. LN: no cervical axillary inguinal adenopathy Pelvic: NL ext GU, labia clear without lesions or rash . Vagina no lesions .Cervix: clear  UTERUS: Neg CMT Adnexa:  clear no masses . PAP donehr hpv    Lab Results  Component Value Date   WBC 5.6 09/15/2016   HGB 14.4 09/15/2016   HCT 41.7 09/15/2016   PLT 253.0 09/15/2016   GLUCOSE 85 09/15/2016   CHOL 192 09/15/2016   TRIG 96.0 09/15/2016   HDL 48.30 09/15/2016   LDLCALC 124 (H) 09/15/2016   ALT 10 09/15/2016   AST 14 09/15/2016   NA 138 09/15/2016   K 4.4 09/15/2016   CL 103 09/15/2016   CREATININE 0.74 09/15/2016   BUN 13 09/15/2016   CO2 27 09/15/2016   TSH 2.26 09/15/2016    BP Readings from Last 3 Encounters:  01/26/18 (!) 160/80  12/10/17 128/82  12/10/16 139/81   ASSESSMENT AND PLAN:  Discussed the following assessment and plan:  Visit for preventive health examination - Plan: Basic  metabolic panel, CBC with Differential/Platelet, Hepatic function panel, Lipid panel, TSH, PAP [[place]]  Encounter for gynecological examination without abnormal finding - Plan: Basic metabolic panel, CBC with Differential/Platelet, Hepatic function panel, Lipid panel, TSH, PAP [[place]]  Elevated LDL cholesterol level - Plan: Basic metabolic panel, CBC with Differential/Platelet, Hepatic function panel, Lipid panel, TSH  Family history of cardiovascular disorder - Plan: Basic metabolic panel, CBC with Differential/Platelet, Hepatic function panel, Lipid panel, TSH  Elevated blood pressure reading - ocass up in office last checkse were normal  conrifrm  dsicussed goals   BMI 32.0-32.9,adult Recheck np readiing home  To ensure  Control  Fu if ongoing elevation Counseled regarding healthy nutrition, exercise, sleep, injury prevention, and healthy weight .  Patient Care Team: Sangita Zani, Standley Brooking, MD as PCP - General (Internal Medicine) Hadassah Pais as Consulting Physician (Internal Medicine) Patient Instructions   bp goal 120/80 best  130/80 acceptable .  Not sure of your tetanus booster  Timing.  Labs today .     Health Maintenance, Female Adopting a healthy lifestyle and getting preventive care can go a long way to promote health and wellness. Talk with your health care provider about what schedule of regular examinations is right for you. This is a good chance for you to check in with your provider about disease prevention and staying healthy. In between checkups, there are plenty of things you can  do on your own. Experts have done a lot of research about which lifestyle changes and preventive measures are most likely to keep you healthy. Ask your health care provider for more information. Weight and diet Eat a healthy diet  Be sure to include plenty of vegetables, fruits, low-fat dairy products, and lean protein.  Do not eat a lot of foods high in solid fats, added sugars, or  salt.  Get regular exercise. This is one of the most important things you can do for your health. ? Most adults should exercise for at least 150 minutes each week. The exercise should increase your heart rate and make you sweat (moderate-intensity exercise). ? Most adults should also do strengthening exercises at least twice a week. This is in addition to the moderate-intensity exercise.  Maintain a healthy weight  Body mass index (BMI) is a measurement that can be used to identify possible weight problems. It estimates body fat based on height and weight. Your health care provider can help determine your BMI and help you achieve or maintain a healthy weight.  For females 58 years of age and older: ? A BMI below 18.5 is considered underweight. ? A BMI of 18.5 to 24.9 is normal. ? A BMI of 25 to 29.9 is considered overweight. ? A BMI of 30 and above is considered obese.  Watch levels of cholesterol and blood lipids  You should start having your blood tested for lipids and cholesterol at 62 years of age, then have this test every 5 years.  You may need to have your cholesterol levels checked more often if: ? Your lipid or cholesterol levels are high. ? You are older than 62 years of age. ? You are at high risk for heart disease.  Cancer screening Lung Cancer  Lung cancer screening is recommended for adults 27-74 years old who are at high risk for lung cancer because of a history of smoking.  A yearly low-dose CT scan of the lungs is recommended for people who: ? Currently smoke. ? Have quit within the past 15 years. ? Have at least a 30-pack-year history of smoking. A pack year is smoking an average of one pack of cigarettes a day for 1 year.  Yearly screening should continue until it has been 15 years since you quit.  Yearly screening should stop if you develop a health problem that would prevent you from having lung cancer treatment.  Breast Cancer  Practice breast  self-awareness. This means understanding how your breasts normally appear and feel.  It also means doing regular breast self-exams. Let your health care provider know about any changes, no matter how small.  If you are in your 20s or 30s, you should have a clinical breast exam (CBE) by a health care provider every 1-3 years as part of a regular health exam.  If you are 17 or older, have a CBE every year. Also consider having a breast X-ray (mammogram) every year.  If you have a family history of breast cancer, talk to your health care provider about genetic screening.  If you are at high risk for breast cancer, talk to your health care provider about having an MRI and a mammogram every year.  Breast cancer gene (BRCA) assessment is recommended for women who have family members with BRCA-related cancers. BRCA-related cancers include: ? Breast. ? Ovarian. ? Tubal. ? Peritoneal cancers.  Results of the assessment will determine the need for genetic counseling and BRCA1 and BRCA2 testing.  Cervical Cancer Your health care provider may recommend that you be screened regularly for cancer of the pelvic organs (ovaries, uterus, and vagina). This screening involves a pelvic examination, including checking for microscopic changes to the surface of your cervix (Pap test). You may be encouraged to have this screening done every 3 years, beginning at age 31.  For women ages 59-65, health care providers may recommend pelvic exams and Pap testing every 3 years, or they may recommend the Pap and pelvic exam, combined with testing for human papilloma virus (HPV), every 5 years. Some types of HPV increase your risk of cervical cancer. Testing for HPV may also be done on women of any age with unclear Pap test results.  Other health care providers may not recommend any screening for nonpregnant women who are considered low risk for pelvic cancer and who do not have symptoms. Ask your health care provider if a  screening pelvic exam is right for you.  If you have had past treatment for cervical cancer or a condition that could lead to cancer, you need Pap tests and screening for cancer for at least 20 years after your treatment. If Pap tests have been discontinued, your risk factors (such as having a new sexual partner) need to be reassessed to determine if screening should resume. Some women have medical problems that increase the chance of getting cervical cancer. In these cases, your health care provider may recommend more frequent screening and Pap tests.  Colorectal Cancer  This type of cancer can be detected and often prevented.  Routine colorectal cancer screening usually begins at 62 years of age and continues through 62 years of age.  Your health care provider may recommend screening at an earlier age if you have risk factors for colon cancer.  Your health care provider may also recommend using home test kits to check for hidden blood in the stool.  A small camera at the end of a tube can be used to examine your colon directly (sigmoidoscopy or colonoscopy). This is done to check for the earliest forms of colorectal cancer.  Routine screening usually begins at age 25.  Direct examination of the colon should be repeated every 5-10 years through 62 years of age. However, you may need to be screened more often if early forms of precancerous polyps or small growths are found.  Skin Cancer  Check your skin from head to toe regularly.  Tell your health care provider about any new moles or changes in moles, especially if there is a change in a mole's shape or color.  Also tell your health care provider if you have a mole that is larger than the size of a pencil eraser.  Always use sunscreen. Apply sunscreen liberally and repeatedly throughout the day.  Protect yourself by wearing long sleeves, pants, a wide-brimmed hat, and sunglasses whenever you are outside.  Heart disease, diabetes, and  high blood pressure  High blood pressure causes heart disease and increases the risk of stroke. High blood pressure is more likely to develop in: ? People who have blood pressure in the high end of the normal range (130-139/85-89 mm Hg). ? People who are overweight or obese. ? People who are African American.  If you are 41-70 years of age, have your blood pressure checked every 3-5 years. If you are 62 years of age or older, have your blood pressure checked every year. You should have your blood pressure measured twice-once when you are at a hospital  or clinic, and once when you are not at a hospital or clinic. Record the average of the two measurements. To check your blood pressure when you are not at a hospital or clinic, you can use: ? An automated blood pressure machine at a pharmacy. ? A home blood pressure monitor.  If you are between 2 years and 25 years old, ask your health care provider if you should take aspirin to prevent strokes.  Have regular diabetes screenings. This involves taking a blood sample to check your fasting blood sugar level. ? If you are at a normal weight and have a low risk for diabetes, have this test once every three years after 62 years of age. ? If you are overweight and have a high risk for diabetes, consider being tested at a younger age or more often. Preventing infection Hepatitis B  If you have a higher risk for hepatitis B, you should be screened for this virus. You are considered at high risk for hepatitis B if: ? You were born in a country where hepatitis B is common. Ask your health care provider which countries are considered high risk. ? Your parents were born in a high-risk country, and you have not been immunized against hepatitis B (hepatitis B vaccine). ? You have HIV or AIDS. ? You use needles to inject street drugs. ? You live with someone who has hepatitis B. ? You have had sex with someone who has hepatitis B. ? You get hemodialysis  treatment. ? You take certain medicines for conditions, including cancer, organ transplantation, and autoimmune conditions.  Hepatitis C  Blood testing is recommended for: ? Everyone born from [birth year] through 1965. ? Anyone with known risk factors for hepatitis C.  Sexually transmitted infections (STIs)  You should be screened for sexually transmitted infections (STIs) including gonorrhea and chlamydia if: ? You are sexually active and are younger than 62 years of age. ? You are older than 62 years of age and your health care provider tells you that you are at risk for this type of infection. ? Your sexual activity has changed since you were last screened and you are at an increased risk for chlamydia or gonorrhea. Ask your health care provider if you are at risk.  If you do not have HIV, but are at risk, it may be recommended that you take a prescription medicine daily to prevent HIV infection. This is called pre-exposure prophylaxis (PrEP). You are considered at risk if: ? You are sexually active and do not regularly use condoms or know the HIV status of your partner(s). ? You take drugs by injection. ? You are sexually active with a partner who has HIV.  Talk with your health care provider about whether you are at high risk of being infected with HIV. If you choose to begin PrEP, you should first be tested for HIV. You should then be tested every 3 months for as long as you are taking PrEP. Pregnancy  If you are premenopausal and you may become pregnant, ask your health care provider about preconception counseling.  If you may become pregnant, take 400 to 800 micrograms (mcg) of folic acid every day.  If you want to prevent pregnancy, talk to your health care provider about birth control (contraception). Osteoporosis and menopause  Osteoporosis is a disease in which the bones lose minerals and strength with aging. This can result in serious bone fractures. Your risk for osteoporosis  can be identified using a bone density  scan.  If you are 57 years of age or older, or if you are at risk for osteoporosis and fractures, ask your health care provider if you should be screened.  Ask your health care provider whether you should take a calcium or vitamin D supplement to lower your risk for osteoporosis.  Menopause may have certain physical symptoms and risks.  Hormone replacement therapy may reduce some of these symptoms and risks. Talk to your health care provider about whether hormone replacement therapy is right for you. Follow these instructions at home:  Schedule regular health, dental, and eye exams.  Stay current with your immunizations.  Do not use any tobacco products including cigarettes, chewing tobacco, or electronic cigarettes.  If you are pregnant, do not drink alcohol.  If you are breastfeeding, limit how much and how often you drink alcohol.  Limit alcohol intake to no more than 1 drink per day for nonpregnant women. One drink equals 12 ounces of beer, 5 ounces of wine, or 1 ounces of hard liquor.  Do not use street drugs.  Do not share needles.  Ask your health care provider for help if you need support or information about quitting drugs.  Tell your health care provider if you often feel depressed.  Tell your health care provider if you have ever been abused or do not feel safe at home. This information is not intended to replace advice given to you by your health care provider. Make sure you discuss any questions you have with your health care provider. Document Released: 12/16/2010 Document Revised: 11/08/2015 Document Reviewed: 03/06/2015 Elsevier Interactive Patient Education  2018 [place]. Essa Malachi M.D.

## 2018-01-26 ENCOUNTER — Ambulatory Visit (INDEPENDENT_AMBULATORY_CARE_PROVIDER_SITE_OTHER): Payer: 59 | Admitting: Internal Medicine

## 2018-01-26 ENCOUNTER — Other Ambulatory Visit (HOSPITAL_COMMUNITY)
Admission: RE | Admit: 2018-01-26 | Discharge: 2018-01-26 | Disposition: A | Discharge status: Home / Self Care | Payer: 59 | Source: Ambulatory Visit | Attending: Internal Medicine | Admitting: Internal Medicine

## 2018-01-26 ENCOUNTER — Encounter: Payer: Self-pay | Admitting: Internal Medicine

## 2018-01-26 VITALS — BP 160/80 | HR 83 | Temp 98.1°F | Ht 62.5 in | Wt 180.3 lb

## 2018-01-26 DIAGNOSIS — R030 Elevated blood-pressure reading, without diagnosis of hypertension: Secondary | ICD-10-CM

## 2018-01-26 DIAGNOSIS — Z01419 Encounter for gynecological examination (general) (routine) without abnormal findings: Secondary | ICD-10-CM

## 2018-01-26 DIAGNOSIS — Z0000 Encounter for general adult medical examination without abnormal findings: Secondary | ICD-10-CM | POA: Diagnosis not present

## 2018-01-26 DIAGNOSIS — Z6832 Body mass index (BMI) 32.0-32.9, adult: Secondary | ICD-10-CM

## 2018-01-26 DIAGNOSIS — E7800 Pure hypercholesterolemia, unspecified: Secondary | ICD-10-CM

## 2018-01-26 DIAGNOSIS — Z8249 Family history of ischemic heart disease and other diseases of the circulatory system: Secondary | ICD-10-CM

## 2018-01-26 LAB — CBC WITH DIFFERENTIAL/PLATELET
BASOS ABS: 0.1 10*3/uL (ref 0.0–0.1)
Basophils Relative: 1.2 % (ref 0.0–3.0)
EOS PCT: 5.3 % — AB (ref 0.0–5.0)
Eosinophils Absolute: 0.3 10*3/uL (ref 0.0–0.7)
HEMATOCRIT: 41.1 % (ref 36.0–46.0)
Hemoglobin: 14.2 g/dL (ref 12.0–15.0)
LYMPHS ABS: 1.7 10*3/uL (ref 0.7–4.0)
Lymphocytes Relative: 35.4 % (ref 12.0–46.0)
MCHC: 34.5 g/dL (ref 30.0–36.0)
MCV: 81.6 fl (ref 78.0–100.0)
MONOS PCT: 6 % (ref 3.0–12.0)
Monocytes Absolute: 0.3 10*3/uL (ref 0.1–1.0)
Neutro Abs: 2.6 10*3/uL (ref 1.4–7.7)
Neutrophils Relative %: 52.1 % (ref 43.0–77.0)
Platelets: 253 10*3/uL (ref 150.0–400.0)
RBC: 5.03 Mil/uL (ref 3.87–5.11)
RDW: 13.6 % (ref 11.5–15.5)
WBC: 4.9 10*3/uL (ref 4.0–10.5)

## 2018-01-26 LAB — HEPATIC FUNCTION PANEL
ALBUMIN: 4.7 g/dL (ref 3.5–5.2)
ALK PHOS: 45 U/L (ref 39–117)
ALT: 15 U/L (ref 0–35)
AST: 17 U/L (ref 0–37)
Bilirubin, Direct: 0.1 mg/dL (ref 0.0–0.3)
TOTAL PROTEIN: 7.1 g/dL (ref 6.0–8.3)
Total Bilirubin: 0.6 mg/dL (ref 0.2–1.2)

## 2018-01-26 LAB — BASIC METABOLIC PANEL
BUN: 11 mg/dL (ref 6–23)
CALCIUM: 9.8 mg/dL (ref 8.4–10.5)
CO2: 29 meq/L (ref 19–32)
Chloride: 102 mEq/L (ref 96–112)
Creatinine, Ser: 0.85 mg/dL (ref 0.40–1.20)
GFR: 71.97 mL/min (ref >60.00)
GLUCOSE: 91 mg/dL (ref 70–99)
Potassium: 4.4 mEq/L (ref 3.5–5.1)
SODIUM: 138 meq/L (ref 135–145)

## 2018-01-26 LAB — LIPID PANEL
CHOLESTEROL: 204 mg/dL — AB (ref 0–200)
HDL: 47.1 mg/dL (ref >39.00)
LDL Cholesterol: 132 mg/dL — ABNORMAL HIGH (ref 0–99)
NONHDL: 156.52
Total CHOL/HDL Ratio: 4
Triglycerides: 124 mg/dL (ref 0.0–149.0)
VLDL: 24.8 mg/dL (ref 0.0–40.0)

## 2018-01-26 LAB — TSH: TSH: 3.29 u[IU]/mL (ref 0.35–4.50)

## 2018-01-26 NOTE — Patient Instructions (Addendum)
bp goal 120/80 best  130/80 acceptable .  Not sure of your tetanus booster  Timing.  Labs today .     Health Maintenance, Female Adopting a healthy lifestyle and getting preventive care can go a long way to promote health and wellness. Talk with your health care provider about what schedule of regular examinations is right for you. This is a good chance for you to check in with your provider about disease prevention and staying healthy. In between checkups, there are plenty of things you can do on your own. Experts have done a lot of research about which lifestyle changes and preventive measures are most likely to keep you healthy. Ask your health care provider for more information. Weight and diet Eat a healthy diet  Be sure to include plenty of vegetables, fruits, low-fat dairy products, and lean protein.  Do not eat a lot of foods high in solid fats, added sugars, or salt.  Get regular exercise. This is one of the most important things you can do for your health. ? Most adults should exercise for at least 150 minutes each week. The exercise should increase your heart rate and make you sweat (moderate-intensity exercise). ? Most adults should also do strengthening exercises at least twice a week. This is in addition to the moderate-intensity exercise.  Maintain a healthy weight  Body mass index (BMI) is a measurement that can be used to identify possible weight problems. It estimates body fat based on height and weight. Your health care provider can help determine your BMI and help you achieve or maintain a healthy weight.  For females 24 years of age and older: ? A BMI below 18.5 is considered underweight. ? A BMI of 18.5 to 24.9 is normal. ? A BMI of 25 to 29.9 is considered overweight. ? A BMI of 30 and above is considered obese.  Watch levels of cholesterol and blood lipids  You should start having your blood tested for lipids and cholesterol at 62 years of age, then have  this test every 5 years.  You may need to have your cholesterol levels checked more often if: ? Your lipid or cholesterol levels are high. ? You are older than 62 years of age. ? You are at high risk for heart disease.  Cancer screening Lung Cancer  Lung cancer screening is recommended for adults 30-71 years old who are at high risk for lung cancer because of a history of smoking.  A yearly low-dose CT scan of the lungs is recommended for people who: ? Currently smoke. ? Have quit within the past 15 years. ? Have at least a 30-pack-year history of smoking. A pack year is smoking an average of one pack of cigarettes a day for 1 year.  Yearly screening should continue until it has been 15 years since you quit.  Yearly screening should stop if you develop a health problem that would prevent you from having lung cancer treatment.  Breast Cancer  Practice breast self-awareness. This means understanding how your breasts normally appear and feel.  It also means doing regular breast self-exams. Let your health care provider know about any changes, no matter how small.  If you are in your 20s or 30s, you should have a clinical breast exam (CBE) by a health care provider every 1-3 years as part of a regular health exam.  If you are 76 or older, have a CBE every year. Also consider having a breast X-ray (mammogram) every year.  If you have a family history of breast cancer, talk to your health care provider about genetic screening.  If you are at high risk for breast cancer, talk to your health care provider about having an MRI and a mammogram every year.  Breast cancer gene (BRCA) assessment is recommended for women who have family members with BRCA-related cancers. BRCA-related cancers include: ? Breast. ? Ovarian. ? Tubal. ? Peritoneal cancers.  Results of the assessment will determine the need for genetic counseling and BRCA1 and BRCA2 testing.  Cervical Cancer Your health care  provider may recommend that you be screened regularly for cancer of the pelvic organs (ovaries, uterus, and vagina). This screening involves a pelvic examination, including checking for microscopic changes to the surface of your cervix (Pap test). You may be encouraged to have this screening done every 3 years, beginning at age 8.  For women ages 4-65, health care providers may recommend pelvic exams and Pap testing every 3 years, or they may recommend the Pap and pelvic exam, combined with testing for human papilloma virus (HPV), every 5 years. Some types of HPV increase your risk of cervical cancer. Testing for HPV may also be done on women of any age with unclear Pap test results.  Other health care providers may not recommend any screening for nonpregnant women who are considered low risk for pelvic cancer and who do not have symptoms. Ask your health care provider if a screening pelvic exam is right for you.  If you have had past treatment for cervical cancer or a condition that could lead to cancer, you need Pap tests and screening for cancer for at least 20 years after your treatment. If Pap tests have been discontinued, your risk factors (such as having a new sexual partner) need to be reassessed to determine if screening should resume. Some women have medical problems that increase the chance of getting cervical cancer. In these cases, your health care provider may recommend more frequent screening and Pap tests.  Colorectal Cancer  This type of cancer can be detected and often prevented.  Routine colorectal cancer screening usually begins at 62 years of age and continues through 62 years of age.  Your health care provider may recommend screening at an earlier age if you have risk factors for colon cancer.  Your health care provider may also recommend using home test kits to check for hidden blood in the stool.  A small camera at the end of a tube can be used to examine your colon  directly (sigmoidoscopy or colonoscopy). This is done to check for the earliest forms of colorectal cancer.  Routine screening usually begins at age 60.  Direct examination of the colon should be repeated every 5-10 years through 62 years of age. However, you may need to be screened more often if early forms of precancerous polyps or small growths are found.  Skin Cancer  Check your skin from head to toe regularly.  Tell your health care provider about any new moles or changes in moles, especially if there is a change in a mole's shape or color.  Also tell your health care provider if you have a mole that is larger than the size of a pencil eraser.  Always use sunscreen. Apply sunscreen liberally and repeatedly throughout the day.  Protect yourself by wearing long sleeves, pants, a wide-brimmed hat, and sunglasses whenever you are outside.  Heart disease, diabetes, and high blood pressure  High blood pressure causes heart disease and  increases the risk of stroke. High blood pressure is more likely to develop in: ? People who have blood pressure in the high end of the normal range (130-139/85-89 mm Hg). ? People who are overweight or obese. ? People who are African American.  If you are 34-35 years of age, have your blood pressure checked every 3-5 years. If you are 32 years of age or older, have your blood pressure checked every year. You should have your blood pressure measured twice-once when you are at a hospital or clinic, and once when you are not at a hospital or clinic. Record the average of the two measurements. To check your blood pressure when you are not at a hospital or clinic, you can use: ? An automated blood pressure machine at a pharmacy. ? A home blood pressure monitor.  If you are between 25 years and 42 years old, ask your health care provider if you should take aspirin to prevent strokes.  Have regular diabetes screenings. This involves taking a blood sample to  check your fasting blood sugar level. ? If you are at a normal weight and have a low risk for diabetes, have this test once every three years after 62 years of age. ? If you are overweight and have a high risk for diabetes, consider being tested at a younger age or more often. Preventing infection Hepatitis B  If you have a higher risk for hepatitis B, you should be screened for this virus. You are considered at high risk for hepatitis B if: ? You were born in a country where hepatitis B is common. Ask your health care provider which countries are considered high risk. ? Your parents were born in a high-risk country, and you have not been immunized against hepatitis B (hepatitis B vaccine). ? You have HIV or AIDS. ? You use needles to inject street drugs. ? You live with someone who has hepatitis B. ? You have had sex with someone who has hepatitis B. ? You get hemodialysis treatment. ? You take certain medicines for conditions, including cancer, organ transplantation, and autoimmune conditions.  Hepatitis C  Blood testing is recommended for: ? Everyone born from [birth year] through 1965. ? Anyone with known risk factors for hepatitis C.  Sexually transmitted infections (STIs)  You should be screened for sexually transmitted infections (STIs) including gonorrhea and chlamydia if: ? You are sexually active and are younger than 62 years of age. ? You are older than 62 years of age and your health care provider tells you that you are at risk for this type of infection. ? Your sexual activity has changed since you were last screened and you are at an increased risk for chlamydia or gonorrhea. Ask your health care provider if you are at risk.  If you do not have HIV, but are at risk, it may be recommended that you take a prescription medicine daily to prevent HIV infection. This is called pre-exposure prophylaxis (PrEP). You are considered at risk if: ? You are sexually active and do not regularly  use condoms or know the HIV status of your partner(s). ? You take drugs by injection. ? You are sexually active with a partner who has HIV.  Talk with your health care provider about whether you are at high risk of being infected with HIV. If you choose to begin PrEP, you should first be tested for HIV. You should then be tested every 3 months for as long as you are taking  PrEP. Pregnancy  If you are premenopausal and you may become pregnant, ask your health care provider about preconception counseling.  If you may become pregnant, take 400 to 800 micrograms (mcg) of folic acid every day.  If you want to prevent pregnancy, talk to your health care provider about birth control (contraception). Osteoporosis and menopause  Osteoporosis is a disease in which the bones lose minerals and strength with aging. This can result in serious bone fractures. Your risk for osteoporosis can be identified using a bone density scan.  If you are 24 years of age or older, or if you are at risk for osteoporosis and fractures, ask your health care provider if you should be screened.  Ask your health care provider whether you should take a calcium or vitamin D supplement to lower your risk for osteoporosis.  Menopause may have certain physical symptoms and risks.  Hormone replacement therapy may reduce some of these symptoms and risks. Talk to your health care provider about whether hormone replacement therapy is right for you. Follow these instructions at home:  Schedule regular health, dental, and eye exams.  Stay current with your immunizations.  Do not use any tobacco products including cigarettes, chewing tobacco, or electronic cigarettes.  If you are pregnant, do not drink alcohol.  If you are breastfeeding, limit how much and how often you drink alcohol.  Limit alcohol intake to no more than 1 drink per day for nonpregnant women. One drink equals 12 ounces of beer, 5 ounces of wine, or 1 ounces  of hard liquor.  Do not use street drugs.  Do not share needles.  Ask your health care provider for help if you need support or information about quitting drugs.  Tell your health care provider if you often feel depressed.  Tell your health care provider if you have ever been abused or do not feel safe at home. This information is not intended to replace advice given to you by your health care provider. Make sure you discuss any questions you have with your health care provider. Document Released: 12/16/2010 Document Revised: 11/08/2015 Document Reviewed: 03/06/2015 Elsevier Interactive Patient Education  [employer].

## 2018-01-28 LAB — CYTOLOGY - PAP
Diagnosis: NEGATIVE
HPV (WINDOPATH): NOT DETECTED

## 2018-02-01 ENCOUNTER — Encounter: Payer: Self-pay | Admitting: *Deleted

## 2018-02-17 ENCOUNTER — Encounter: Payer: Self-pay | Admitting: Internal Medicine

## 2018-02-17 ENCOUNTER — Ambulatory Visit: Payer: 59 | Admitting: Internal Medicine

## 2018-02-17 VITALS — BP 138/78 | HR 73 | Temp 98.3°F | Wt 181.2 lb

## 2018-02-17 DIAGNOSIS — H00015 Hordeolum externum left lower eyelid: Secondary | ICD-10-CM | POA: Diagnosis not present

## 2018-02-17 MED ORDER — BACITRACIN-POLYMYXIN B 500-10000 UNIT/GM OP OINT: 1.0000 "application " | TOPICAL_OINTMENT | Freq: Two times a day (BID) | OPHTHALMIC | 0 refills | Status: DC

## 2018-02-17 NOTE — Patient Instructions (Signed)

## 2018-02-17 NOTE — Progress Notes (Signed)
Subjective:    Patient ID: Dana Bailey, female    DOB: 03/24/1956, 62 y.o.   MRN: [id number]  HPI 62 year old patient who presents with a stye involving the left lower lid of 2 days duration.  She states that she has had styes in the past. She does have a significant sulfa allergy.  Past Medical History:  Diagnosis Date  . Allergy    dust,pollen,ragweed,oak,pine  . Chicken pox   . Congenital heart defect   . Hay fever   . Hepatitis     hepatitis C from blood transfusion  . 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 High blood pressure    white coat syndrome  . Hx of echocardiogram 2013   nl except 2 diastolic dysfunction nl lv   no pht,no asd  . Hx of heart surgery    asd closure  . Kidney stones 5035,4656  . Need for rhogam due to Rh negative mother    x 2 1982 and 1986  . Shingles 12/2011  . Transfusion history 1974   4 units  1 from father  heart surgery     Social History   Socioeconomic History  . Marital status: Married    Spouse name: Not on file  . Number of children: Not on file  . Years of education: Not on file  . Highest education level: Not on file  Occupational History  . Not on file  Social Needs  . Financial resource strain: Not on file  . Food insecurity:    Worry: Not on file    Inability: Not on file  . Transportation needs:    Medical: Not on file    Non-medical: Not on file  Tobacco Use  . Smoking status: Never Smoker  . Smokeless tobacco: Never Used  Substance and Sexual Activity  . Alcohol use: Yes    Comment: rarely  . Drug use: No  . Sexual activity: Not on file  Lifestyle  . Physical activity:    Days per week: Not on file    Minutes per session: Not on file  . Stress: Not on file  Relationships  . Social  connections:    Talks on phone: Not on file    Gets together: Not on file    Attends religious service: Not on file    Active member of club or organization: Not on file    Attends meetings of clubs or organizations: Not on file    Relationship status: Not on file  . Intimate partner violence:    Fear of current or ex partner: Not on file    Emotionally abused: Not on file    Physically abused: Not on file    Forced sexual activity: Not on file  Other Topics Concern  . Not on file  Social History Narrative   Household of 2 married   PET your T2    BS in math  [employer]  born [place]   In [place] in [place] 2 para 2 [phone]   Negative TAD seatbelts at a smoke alarm.   Last Pap 6 2007 last period 3 2000 and 5T depth for  5 2013 no recent mammogram no colonoscopy    Past Surgical History:  Procedure Laterality Date  . ASD REPAIR  1974   Age 62  . KIDNEY STONE SURGERY     right kidney  . LITHOTRIPSY  2013  . OVARIAN CYST REMOVAL  1998   Laparoscopic 1998  . TONSILLECTOMY AND ADENOIDECTOMY  1962    Family History  Problem Relation Age of Onset  . Arthritis Mother        Degenerative disc disease  . Hyperlipidemia Mother   . Hypertension Mother   . Kidney disease Mother   . Mental illness Mother   . Arthritis Father   . Hyperlipidemia Father   . Heart disease Father   . Stroke Father   . Hypertension Father   . Diabetes Father   . Alzheimer's disease Father   . Glaucoma Unknown   . Macular degeneration Unknown   . Breast cancer Sister   . Colon cancer Neg Hx     Allergies  Allergen Reactions  . Cephalexin Hives  . Other     FLU VACCINE-MOUTH SORES AND BLISTERS  . Septra [Sulfamethoxazole-Trimethoprim] Rash  . Sulfa Antibiotics Rash    Current Outpatient Medications on File Prior to Visit  Medication Sig Dispense Refill  . AMOXICILLIN PO Take 2,000 mg by mouth. 2000mg  po x 1 prior to denal  procedures    . fexofenadine (ALLEGRA) 180 MG tablet Take 180 mg by mouth daily.    . Naproxen Sodium (ALEVE PO) Take by mouth.    . sodium chloride (OCEAN) 0.65 % SOLN nasal spray Place 1 spray into both nostrils as needed for congestion.     Current Facility-Administered Medications on File Prior to Visit  Medication Dose Route Frequency Provider Last Rate Last Dose  . 0.9 %  sodium chloride infusion  500 mL Intravenous Continuous Pyrtle, Lajuan Lines, MD        BP 138/78 (BP Location: Right Arm, Patient Position: Sitting, Cuff Size: Large)   Pulse 73   Temp 98.3 F (36.8 C) (Oral)   Wt 181 lb 3.2 oz (82.2 kg)   SpO2 99%   BMI 32.61 kg/m      Review of Systems  Constitutional: Negative.   HENT: Negative for congestion, dental problem, hearing loss, rhinorrhea, sinus pressure, sore throat and tinnitus.   Eyes: Positive for pain, redness and itching. Negative for discharge and visual disturbance.  Respiratory: Negative for cough and shortness of breath.   Cardiovascular: Negative for chest pain, palpitations and leg swelling.  Gastrointestinal: Negative for abdominal distention, abdominal pain, blood in stool, constipation, diarrhea, nausea and vomiting.  Genitourinary: Negative for difficulty urinating, dysuria, flank pain, frequency, hematuria, pelvic pain, urgency, vaginal bleeding, vaginal discharge and vaginal pain.  Musculoskeletal: Negative for arthralgias, gait problem and joint swelling.  Skin: Negative for rash.  Neurological: Negative for dizziness, syncope, speech difficulty, weakness, numbness and headaches.  Hematological: Negative for adenopathy.  Psychiatric/Behavioral: Negative for agitation, behavioral problems and dysphoric mood. The patient is not nervous/anxious.        Objective:   Physical Exam  Constitutional: She appears well-developed and well-nourished. No distress.  Eyes:  A small stye in the midportion of the left lower lid with some mild erythema.   Conjunctiva clear          Assessment & Plan:   Stye left lower lid.  Will treat with warm compresses and antibiotic ointment. Patient information dispensed  Sulfa allergy  Marletta Lor

## 2019-05-20 ENCOUNTER — Other Ambulatory Visit: Payer: Self-pay | Admitting: Internal Medicine

## 2019-10-18 ENCOUNTER — Other Ambulatory Visit: Payer: Self-pay | Admitting: Internal Medicine

## 2019-10-18 DIAGNOSIS — Z1231 Encounter for screening mammogram for malignant neoplasm of breast: Secondary | ICD-10-CM

## 2019-11-21 ENCOUNTER — Ambulatory Visit: Payer: 59

## 2019-12-14 ENCOUNTER — Other Ambulatory Visit: Payer: Self-pay

## 2019-12-14 ENCOUNTER — Ambulatory Visit
Admission: RE | Admit: 2019-12-14 | Discharge: 2019-12-14 | Disposition: A | Discharge status: Home / Self Care | Payer: 59 | Source: Ambulatory Visit | Attending: Internal Medicine | Admitting: Internal Medicine

## 2019-12-14 DIAGNOSIS — Z1231 Encounter for screening mammogram for malignant neoplasm of breast: Secondary | ICD-10-CM

## 2020-03-11 ENCOUNTER — Emergency Department (HOSPITAL_COMMUNITY): Payer: 59

## 2020-03-11 ENCOUNTER — Other Ambulatory Visit: Payer: Self-pay

## 2020-03-11 ENCOUNTER — Emergency Department (HOSPITAL_COMMUNITY)
Admission: EM | Admit: 2020-03-11 | Discharge: 2020-03-11 | Disposition: A | Discharge status: Home / Self Care | Payer: 59 | Attending: Emergency Medicine | Admitting: Emergency Medicine

## 2020-03-11 ENCOUNTER — Encounter (HOSPITAL_COMMUNITY): Payer: Self-pay | Admitting: Emergency Medicine

## 2020-03-11 DIAGNOSIS — E876 Hypokalemia: Secondary | ICD-10-CM | POA: Insufficient documentation

## 2020-03-11 DIAGNOSIS — Z79899 Other long term (current) drug therapy: Secondary | ICD-10-CM | POA: Diagnosis not present

## 2020-03-11 DIAGNOSIS — R1013 Epigastric pain: Secondary | ICD-10-CM | POA: Diagnosis present

## 2020-03-11 LAB — HEPATIC FUNCTION PANEL
ALT: 35 U/L (ref 0–44)
AST: 47 U/L — ABNORMAL HIGH (ref 15–41)
Albumin: 4.3 g/dL (ref 3.5–5.0)
Alkaline Phosphatase: 58 U/L (ref 38–126)
Bilirubin, Direct: <0.1 mg/dL (ref 0.0–0.2)
Total Bilirubin: 0.6 mg/dL (ref 0.3–1.2)
Total Protein: 6.9 g/dL (ref 6.5–8.1)

## 2020-03-11 LAB — CBC
HCT: 41.6 % (ref 36.0–46.0)
Hemoglobin: 14.2 g/dL (ref 12.0–15.0)
MCH: 28.8 pg (ref 26.0–34.0)
MCHC: 34.1 g/dL (ref 30.0–36.0)
MCV: 84.4 fL (ref 80.0–100.0)
Platelets: 293 10*3/uL (ref 150–400)
RBC: 4.93 MIL/uL (ref 3.87–5.11)
RDW: 12.7 % (ref 11.5–15.5)
WBC: 7.5 10*3/uL (ref 4.0–10.5)
nRBC: 0 % (ref 0.0–0.2)

## 2020-03-11 LAB — BASIC METABOLIC PANEL
Anion gap: 16 — ABNORMAL HIGH (ref 5–15)
BUN: 9 mg/dL (ref 8–23)
CO2: 20 mmol/L — ABNORMAL LOW (ref 22–32)
Calcium: 9.4 mg/dL (ref 8.9–10.3)
Chloride: 103 mmol/L (ref 98–111)
Creatinine, Ser: 0.92 mg/dL (ref 0.44–1.00)
GFR calc Af Amer: >60 mL/min (ref >60)
GFR calc non Af Amer: >60 mL/min (ref >60)
Glucose, Bld: 132 mg/dL — ABNORMAL HIGH (ref 70–99)
Potassium: 2.7 mmol/L — CL (ref 3.5–5.1)
Sodium: 139 mmol/L (ref 135–145)

## 2020-03-11 LAB — TROPONIN I (HIGH SENSITIVITY)
Troponin I (High Sensitivity): 3 ng/L (ref <18)
Troponin I (High Sensitivity): 4 ng/L (ref <18)

## 2020-03-11 LAB — LIPASE, BLOOD: Lipase: 24 U/L (ref 11–51)

## 2020-03-11 MED ORDER — POTASSIUM CHLORIDE CRYS ER 20 MEQ PO TBCR: 20.0000 meq | EXTENDED_RELEASE_TABLET | Freq: Two times a day (BID) | ORAL | 0 refills | Status: DC

## 2020-03-11 MED ORDER — POTASSIUM CHLORIDE CRYS ER 20 MEQ PO TBCR
40.0000 meq | EXTENDED_RELEASE_TABLET | Freq: Once | ORAL | Status: AC
  Administered 2020-03-11: 40 meq via ORAL
  Filled 2020-03-11: qty 2

## 2020-03-11 NOTE — ED Provider Notes (Signed)
[facility] EMERGENCY DEPARTMENT Provider Note  CSN: [id number] Arrival date & time: 03/11/20 1513    History Chief Complaint  Patient presents with  . Chest Pain    HPI  Dana Bailey is a 64 y.o. female with no significant PMH reports she was in her normal state of health earlier today, had brunch and then went to [facility]. She reports around 1:30pm after eating a cracker, she had sudden onset of severe, cramping epigastric pain, associated with nausea, diaphoresis and hand cramping. Pain did not radiate into her back, chest, neck or shoulders. She took some advil and pepcid without improvement and so her husband took her to a local UC who subsequently called EMS. She was given ASA x 4 and NTG with no improvement. She was still feeling severe pain on arrival here but symptoms subsided while she was waiting for an available ED exam room. She reports a history of incidental gall stones found on liver US (she contracted Hep C from a transfusion in the 1970s after an ASD repair, she has completed Harvoni and has periodic liver US for surveillance). She reports she is completely symptom free at the time of my evaluation.    Past Medical History:  Diagnosis Date  . Allergy    dust,pollen,ragweed,oak,pine  . Chicken pox   . Congenital heart defect   . Hay fever   . Hepatitis     hepatitis C from blood transfusion  . 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 High blood pressure    white coat syndrome  . Hx of echocardiogram 2013   nl except 2 diastolic dysfunction nl lv   no pht,no asd  . Hx of heart surgery    asd closure  . Kidney stones 4742,5956  . Need for rhogam due to Rh negative mother    x 2 1982 and 1986  . Shingles 12/2011  . Transfusion history 1974   4  units  1 from father  heart surgery    Past Surgical History:  Procedure Laterality Date  . ASD REPAIR  1974   Age 84  . KIDNEY STONE SURGERY     right kidney  . LITHOTRIPSY  2013  . OVARIAN CYST REMOVAL  1998   Laparoscopic 1998  . TONSILLECTOMY AND ADENOIDECTOMY  1962    Family History  Problem Relation Age of Onset  . Arthritis Mother        Degenerative disc disease  . Hyperlipidemia Mother   . Hypertension Mother   . Kidney disease Mother   . Mental illness Mother   . Arthritis Father   . Hyperlipidemia Father   . Heart disease Father   . Stroke Father   . Hypertension Father   . Diabetes Father   . Alzheimer's disease Father   . Glaucoma Other   . Macular degeneration Other   . Breast cancer Sister   . Colon cancer Neg Hx     Social History   Tobacco Use  . Smoking status: Never Smoker  . Smokeless tobacco: Never Used  Substance Use Topics  . Alcohol use: Yes    Comment: rarely  . Drug use: No     Home Medications Prior  to Admission medications   Medication Sig Start Date End Date Taking? Authorizing Provider  AMOXICILLIN PO Take 2,000 mg by mouth. 2000mg  po x 1 prior to denal procedures    [provider]  bacitracin-polymyxin b (POLYSPORIN) ophthalmic ointment Place 1 application into the left eye 2 (two) times daily. apply to eye every 12 hours while awake 02/17/18   Marletta Lor, MD  fexofenadine (ALLEGRA) 180 MG tablet Take 180 mg by mouth daily.    [provider]  Naproxen Sodium (ALEVE PO) Take by mouth.    [provider]  potassium chloride SA (KLOR-CON) 20 MEQ tablet Take 1 tablet (20 mEq total) by mouth 2 (two) times daily. 03/11/20   Truddie Hidden, MD  sodium chloride (OCEAN) 0.65 % SOLN nasal spray Place 1 spray into both nostrils as needed for congestion.    [provider]     Allergies    Cephalexin, Other, Septra [sulfamethoxazole-trimethoprim], and Sulfa antibiotics   Review of  Systems   Review of Systems A comprehensive review of systems was completed and negative except as noted in HPI.    Physical Exam BP (!) 159/115   Pulse 71   Temp 98 F (36.7 C) (Oral)   Resp 16   SpO2 99%   Physical Exam Vitals and nursing note reviewed.  Constitutional:      Appearance: Normal appearance.  HENT:     Head: Normocephalic and atraumatic.     Nose: Nose normal.     Mouth/Throat:     Mouth: Mucous membranes are moist.  Eyes:     Extraocular Movements: Extraocular movements intact.     Conjunctiva/sclera: Conjunctivae normal.  Cardiovascular:     Rate and Rhythm: Normal rate.  Pulmonary:     Effort: Pulmonary effort is normal.     Breath sounds: Normal breath sounds.  Abdominal:     General: Abdomen is flat.     Palpations: Abdomen is soft.     Tenderness: There is no abdominal tenderness. There is no guarding. Negative signs include Murphy's sign and McBurney's sign.  Musculoskeletal:        General: No swelling. Normal range of motion.     Cervical back: Neck supple.  Skin:    General: Skin is warm and dry.  Neurological:     General: No focal deficit present.     Mental Status: She is alert.  Psychiatric:        Mood and Affect: Mood normal.      ED Results / Procedures / Treatments   Labs (all labs ordered are listed, but only abnormal results are displayed) Labs Reviewed  BASIC METABOLIC PANEL - Abnormal; Notable for the following components:      Result Value   Potassium 2.7 (*)    CO2 20 (*)    Glucose, Bld 132 (*)    Anion gap 16 (*)    All other components within normal limits  HEPATIC FUNCTION PANEL - Abnormal; Notable for the following components:   AST 47 (*)    All other components within normal limits  CBC  LIPASE, BLOOD  TROPONIN I (HIGH SENSITIVITY)  TROPONIN I (HIGH SENSITIVITY)    EKG EKG Interpretation  Date/Time:  Sunday March 11 2020 15:23:30 EDT Ventricular Rate:  69 PR Interval:  150 QRS Duration: 78 QT  Interval:  454 QTC Calculation: 486 R Axis:   56 Text Interpretation: Normal sinus rhythm Low voltage QRS Borderline ECG No old tracing to  compare Confirmed by Calvert Cantor [id number]) on 03/11/2020 5:49:12 PM   Radiology DG Chest 2 View  Result Date: 03/11/2020 CLINICAL DATA:  Chest pain EXAM: CHEST - 2 VIEW COMPARISON:  None. FINDINGS: The heart size and mediastinal contours are within normal limits. Both lungs are clear. The visualized skeletal structures are unremarkable. IMPRESSION: No active cardiopulmonary disease. Electronically Signed   By: Constance Holster M.D.   On: 03/11/2020 16:02    Procedures Procedures  Medications Ordered in the ED Medications  potassium chloride SA (KLOR-CON) CR tablet 40 mEq (40 mEq Oral Given 03/11/20 1850)     MDM Rules/Calculators/A&P MDM Patient with epigastric pain, no chest pain or SOB. No history of ACS and no significant risk factors. History is more concerning for biliary disease. Will add LFTs and lipase to labs done in triage. Second Trop pending as well.  ED Course  I have reviewed the triage vital signs and the nursing notes.  Pertinent labs & imaging results that were available during my care of the patient were reviewed by me and considered in my medical decision making (see chart for details).  Clinical Course as of Mar 11 1944  Sun Mar 11, 2020  1808 CBC is normal. BMP shows hypokalemia. Patient reports this is a long term problem for her. She does not routinely take supplements. Will give oral repletion here.    [CS]  1930 LFTs and Lipase are normal.    [CS]  1931 Repeat Trop is neg.    [CS]  1943 Patient remains pain free. No clear etiology for her pain but given normal exam now and normal labs, do not feel that emergent imaging is necessary. Patient advised to follow up with PCP and GI. RTED if pain returns for reassessment.    [CS]    Clinical Course User Index [CS] Truddie Hidden, MD    Final Clinical  Impression(s) / ED Diagnoses Final diagnoses:  Epigastric pain  Hypokalemia    Rx / DC Orders ED Discharge Orders         Ordered    potassium chloride SA (KLOR-CON) 20 MEQ tablet  2 times daily        03/11/20 1945           Truddie Hidden, MD 03/11/20 1945

## 2020-03-11 NOTE — ED Notes (Signed)
Potassium 2.7

## 2020-03-11 NOTE — ED Triage Notes (Addendum)
Pt to triage via [facility] from [facility].  Reports epigastric pain that started around 1:30pm.  Went to UC at 2pm and they administered NTG and ASA.  EMS administered 1 NTG.  Pain went from "12/10" to 8/10 but now rates as 10/10.  Reports feeling clammy and SOB.  Denies nausea and vomiting. 20g L hand.

## 2020-03-11 NOTE — ED Notes (Signed)
All discharge instructions reviewed with pt. Pt denies questions at this time. Pt discharged without issue.  

## 2020-03-27 NOTE — Progress Notes (Signed)
Chief Complaint  Patient presents with  . Follow-up    ED    HPI: Dana Bailey 64 y.o. come in for FU ED  9 26 for severe epigastric pain and low potassium    Onset 2 hours  after eating high epigastric    Took prilosec  But got worse clammy and  Cramping     Pale and went to urgent care. And went to ed for   R/o MI eval  Ambulance went to hospital .  Martin Majestic away eventually in ED.     Gave   Oral potassium and   2  Per day for  2 days.    A bit afraid to eat    prilosec x 1 helped .  was very tender .  But better   Bp up in ed  Up and down  recnetly may have been higher  In 150 range   Hx of renal stones.  Very strong fam hx of  Thyroid disease  Graves?    Concern that lfts  Were abn in ed . Has hx of hep c   ROS: See pertinent positives and negatives per HPI. No diarrhea  One loose stoll day of pain   No supplements   Magnesium  Other   Past Medical History:  Diagnosis Date  . Allergy    dust,pollen,ragweed,oak,pine  . Chicken pox   . Congenital heart defect   . Hay fever   . Hepatitis     hepatitis C from blood transfusion  . 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 High blood pressure    white coat syndrome  . Hx of echocardiogram 2013   nl except 2 diastolic dysfunction nl lv   no pht,no asd  . Hx of heart surgery    asd closure  . Kidney stones 2778,2423  . Need for rhogam due to Rh negative mother    x 2 1982 and 1986  . Shingles 12/2011  . Transfusion history 1974   4 units  1 from father  heart surgery    Family History  Problem Relation Age of Onset  . Arthritis Mother        Degenerative disc disease  . Hyperlipidemia Mother   . Hypertension Mother   . Kidney disease Mother   . Mental illness Mother   . Arthritis Father   . Hyperlipidemia  Father   . Heart disease Father   . Stroke Father   . Hypertension Father   . Diabetes Father   . Alzheimer's disease Father   . Glaucoma Other   . Macular degeneration Other   . Breast cancer Sister   . Colon cancer Neg Hx     Social History   Socioeconomic History  . Marital status: Married    Spouse name: Not on file  . Number of children: Not on file  . Years of education: Not on file  . Highest education level: Not on file  Occupational History  . Not on file  Tobacco Use  . Smoking status: Never Smoker  . Smokeless tobacco: Never Used  Substance and Sexual Activity  . Alcohol use: Yes    Comment: rarely  . Drug  use: No  . Sexual activity: Not on file  Other Topics Concern  . Not on file  Social History Narrative   Household of 2 married   PET your T2    BS in math  [employer]  born [place]   In [place] in [place] 2 para 2 [phone]   Negative TAD seatbelts at a smoke alarm.   Last Pap 6 2007 last period 3 2000 and 5T depth for 5 2013 no recent mammogram no colonoscopy   Social Determinants of Health   Financial Resource Strain:   . Difficulty of Paying Living Expenses: Not on file  Food Insecurity:   . Worried About Charity fundraiser in the Last Year: Not on file  . Ran Out of Food in the Last Year: Not on file  Transportation Needs:   . Lack of Transportation (Medical): Not on file  . Lack of Transportation (Non-Medical): Not on file  Physical Activity:   . Days of Exercise per Week: Not on file  . Minutes of Exercise per Session: Not on file  Stress:   . Feeling of Stress : Not on file  Social Connections:   . Frequency of Communication with Friends and Family: Not on file  . Frequency of Social Gatherings with Friends and Family: Not on file  . Attends Religious Services: Not on file  . Active Member of Clubs or Organizations: Not on file  . Attends Archivist Meetings: Not on  file  . Marital Status: Not on file    Outpatient Medications Prior to Visit  Medication Sig Dispense Refill  . AMOXICILLIN PO Take 2,000 mg by mouth. 2000mg  po x 1 prior to denal procedures    . fexofenadine (ALLEGRA) 180 MG tablet Take 180 mg by mouth daily.    . Naproxen Sodium (ALEVE PO) Take by mouth.    . sodium chloride (OCEAN) 0.65 % SOLN nasal spray Place 1 spray into both nostrils as needed for congestion.    . Potassium Citrate 99 MG CAPS     . bacitracin-polymyxin b (POLYSPORIN) ophthalmic ointment Place 1 application into the left eye 2 (two) times daily. apply to eye every 12 hours while awake 3.5 g 0  . potassium chloride SA (KLOR-CON) 20 MEQ tablet Take 1 tablet (20 mEq total) by mouth 2 (two) times daily. 6 tablet 0   Facility-Administered Medications Prior to Visit  Medication Dose Route Frequency Provider Last Rate Last Admin  . 0.9 %  sodium chloride infusion  500 mL Intravenous Continuous Pyrtle, Lajuan Lines, MD         EXAM:  BP (!) 160/90   Pulse 64   Temp 97.8 F (36.6 C) (Oral)   Ht 5' 3.5" (1.613 m)   Wt 176 lb 3.2 oz (79.9 kg)   SpO2 98%   BMI 30.72 kg/m   Body mass index is 30.72 kg/m. Repeat bp 162/100 right  164/98 left sitting  GENERAL: vitals reviewed and listed above, alert, oriented, appears well hydrated and in no acute distress HEENT: atraumatic, conjunctiva  clear, no obvious abnormalities on inspection of external nose and ears OP masked  NECK: no obvious masses on inspection palpation  LUNGS: clear to auscultation bilaterally, no wheezes, rales or rhonchi, good air movement Abdomen:  Sof,t normal bowel sounds without hepatosplenomegaly, no guarding rebound or masses no CVA tenderness CV: HRRR, no clubbing cyanosis or  peripheral edema nl cap refill  MS: moves all extremities without noticeable focal  abnormality PSYCH: pleasant and cooperative, no obvious depression or anxiety Lab Results  Component Value Date   WBC 7.5 03/11/2020   HGB  14.2 03/11/2020   HCT 41.6 03/11/2020   PLT 293 03/11/2020   GLUCOSE 132 (H) 03/11/2020   CHOL 204 (H) 01/26/2018   TRIG 124.0 01/26/2018   HDL 47.10 01/26/2018   LDLCALC 132 (H) 01/26/2018   ALT 35 03/11/2020   AST 47 (H) 03/11/2020   NA 139 03/11/2020   K 2.7 (LL) 03/11/2020   CL 103 03/11/2020   CREATININE 0.92 03/11/2020   BUN 9 03/11/2020   CO2 20 (L) 03/11/2020   TSH 3.29 01/26/2018   BP Readings from Last 3 Encounters:  03/28/20 (!) 160/90  03/11/20 (!) 157/72  02/17/18 138/78    ASSESSMENT AND PLAN:  Discussed the following assessment and plan:  Epigastric pain - Plan: BASIC METABOLIC PANEL WITH GFR, Hepatic function panel, Magnesium, Hep C RNA, Quant, T4, free, TSH, TSH, T4, free, Hep C RNA, Quant, Magnesium, Hepatic function panel, BASIC METABOLIC PANEL WITH GFR, US Abdomen Complete  Hypokalemia - Plan: BASIC METABOLIC PANEL WITH GFR, Hepatic function panel, Magnesium, Hep C RNA, Quant, T4, free, TSH, TSH, T4, free, Hep C RNA, Quant, Magnesium, Hepatic function panel, BASIC METABOLIC PANEL WITH GFR  Elevated blood pressure reading - Plan: BASIC METABOLIC PANEL WITH GFR, Hepatic function panel, Magnesium, Hep C RNA, Quant, T4, free, TSH, TSH, T4, free, Hep C RNA, Quant, Magnesium, Hepatic function panel, BASIC METABOLIC PANEL WITH GFR  Gallstones - Plan: BASIC METABOLIC PANEL WITH GFR, Hepatic function panel, Magnesium, Hep C RNA, Quant, T4, free, TSH, TSH, T4, free, Hep C RNA, Quant, Magnesium, Hepatic function panel, BASIC METABOLIC PANEL WITH GFR, US Abdomen Complete  History of hepatitis C - Plan: BASIC METABOLIC PANEL WITH GFR, Hepatic function panel, Magnesium, Hep C RNA, Quant, T4, free, TSH, TSH, T4, free, Hep C RNA, Quant, Magnesium, Hepatic function panel, BASIC METABOLIC PANEL WITH GFR, US Abdomen Complete  Abnormal LFTs - Plan: US Abdomen Complete Acute severe epigastric pain  ) CV r/o)   With hx of  gallstones   Mild elevation of transaminitis    Most  typical  however hypokalemia  Is unexplained  And creeping elevated bp     Noted in ed and now office .  May add medication  For control and May check am aldo levels and  Other  abd imagine  otherwise and do further eval . Less likely elapse Hep C  At this time  Lab check   bp readings and rov vv in 2-3 weeks or as indicated  -Patient advised to return or notify health care team  if  new concerns arise. 45 minutes   review  Ed visit  Scans counsel plan  Patient Instructions  Please bring your blood pressure cuff to next appointment Take blood pressure readings twice a day for 5-7 days and record .     Take 2 -3 readings at each sitting .  Can send in readings  by My Chart.     Before checking your blood pressure make sure: You are seated and quite for 5 min before checking Feet are flat on the floor Siting in chair with your back supported straight up and down Arm resting on table or arm of chair at heart level Bladder is empty You have NOT had caffeine or tobacco within the last 30 min  Lab today  Imaging    Abdomen liver Gallbaldder  Area .  May need more imaging depending on  Lab etc .   Plan fu   2-3 week  Video visit   Standley Brooking. Suhaib Guzzo M.D.

## 2020-03-28 ENCOUNTER — Encounter: Payer: Self-pay | Admitting: Internal Medicine

## 2020-03-28 ENCOUNTER — Ambulatory Visit: Payer: 59 | Admitting: Internal Medicine

## 2020-03-28 ENCOUNTER — Other Ambulatory Visit: Payer: Self-pay

## 2020-03-28 VITALS — BP 160/90 | HR 64 | Temp 97.8°F | Ht 63.5 in | Wt 176.2 lb

## 2020-03-28 DIAGNOSIS — K8020 Calculus of gallbladder without cholecystitis without obstruction: Secondary | ICD-10-CM

## 2020-03-28 DIAGNOSIS — R030 Elevated blood-pressure reading, without diagnosis of hypertension: Secondary | ICD-10-CM | POA: Diagnosis not present

## 2020-03-28 DIAGNOSIS — R7989 Other specified abnormal findings of blood chemistry: Secondary | ICD-10-CM

## 2020-03-28 DIAGNOSIS — E876 Hypokalemia: Secondary | ICD-10-CM | POA: Diagnosis not present

## 2020-03-28 DIAGNOSIS — R1013 Epigastric pain: Secondary | ICD-10-CM

## 2020-03-28 DIAGNOSIS — Z8619 Personal history of other infectious and parasitic diseases: Secondary | ICD-10-CM

## 2020-03-28 DIAGNOSIS — R945 Abnormal results of liver function studies: Secondary | ICD-10-CM

## 2020-03-28 NOTE — Patient Instructions (Signed)
Please bring your blood pressure cuff to next appointment Take blood pressure readings twice a day for 5-7 days and record .     Take 2 -3 readings at each sitting .  Can send in readings  by My Chart.     Before checking your blood pressure make sure: You are seated and quite for 5 min before checking Feet are flat on the floor Siting in chair with your back supported straight up and down Arm resting on table or arm of chair at heart level Bladder is empty You have NOT had caffeine or tobacco within the last 30 min  Lab today  Imaging    Abdomen liver Gallbaldder  Area .  May need more imaging depending on  Lab etc .   Plan fu   2-3 week  Video visit

## 2020-03-30 LAB — HEPATIC FUNCTION PANEL
AG Ratio: 2 (calc) (ref 1.0–2.5)
ALT: 24 U/L (ref 6–29)
AST: 27 U/L (ref 10–35)
Albumin: 4.8 g/dL (ref 3.6–5.1)
Alkaline phosphatase (APISO): 48 U/L (ref 37–153)
Bilirubin, Direct: 0.1 mg/dL (ref 0.0–0.2)
Globulin: 2.4 g/dL (calc) (ref 1.9–3.7)
Indirect Bilirubin: 0.4 mg/dL (calc) (ref 0.2–1.2)
Total Bilirubin: 0.5 mg/dL (ref 0.2–1.2)
Total Protein: 7.2 g/dL (ref 6.1–8.1)

## 2020-03-30 LAB — BASIC METABOLIC PANEL WITH GFR
BUN: 12 mg/dL (ref 7–25)
CO2: 24 mmol/L (ref 20–32)
Calcium: 9.7 mg/dL (ref 8.6–10.4)
Chloride: 99 mmol/L (ref 98–110)
Creat: 0.66 mg/dL (ref 0.50–0.99)
GFR, Est African American: 108 mL/min/{1.73_m2} (ref >=60)
GFR, Est Non African American: 93 mL/min/{1.73_m2} (ref >=60)
Glucose, Bld: 82 mg/dL (ref 65–99)
Potassium: 3.8 mmol/L (ref 3.5–5.3)
Sodium: 135 mmol/L (ref 135–146)

## 2020-03-30 LAB — MAGNESIUM: Magnesium: 2 mg/dL (ref 1.5–2.5)

## 2020-03-30 LAB — TSH: TSH: 2.21 mIU/L (ref 0.40–4.50)

## 2020-03-30 LAB — HEPATITIS C RNA QUANTITATIVE
HCV RNA, PCR, QN (Log): <1.18 log IU/mL
HCV RNA, PCR, QN: <15 IU/mL

## 2020-03-30 LAB — T4, FREE: Free T4: 1 ng/dL (ref 0.8–1.8)

## 2020-04-06 ENCOUNTER — Ambulatory Visit
Admission: RE | Admit: 2020-04-06 | Discharge: 2020-04-06 | Disposition: A | Discharge status: Home / Self Care | Payer: 59 | Source: Ambulatory Visit | Attending: Internal Medicine | Admitting: Internal Medicine

## 2020-04-06 DIAGNOSIS — R1013 Epigastric pain: Secondary | ICD-10-CM

## 2020-04-06 DIAGNOSIS — Z8619 Personal history of other infectious and parasitic diseases: Secondary | ICD-10-CM

## 2020-04-06 DIAGNOSIS — R945 Abnormal results of liver function studies: Secondary | ICD-10-CM

## 2020-04-06 DIAGNOSIS — R7989 Other specified abnormal findings of blood chemistry: Secondary | ICD-10-CM

## 2020-04-06 DIAGNOSIS — K8020 Calculus of gallbladder without cholecystitis without obstruction: Secondary | ICD-10-CM

## 2020-04-12 NOTE — Progress Notes (Signed)
Disc at upcoming [place]  v

## 2020-04-18 ENCOUNTER — Encounter: Payer: Self-pay | Admitting: Internal Medicine

## 2020-04-18 ENCOUNTER — Telehealth (INDEPENDENT_AMBULATORY_CARE_PROVIDER_SITE_OTHER): Payer: 59 | Admitting: Internal Medicine

## 2020-04-18 DIAGNOSIS — E876 Hypokalemia: Secondary | ICD-10-CM

## 2020-04-18 DIAGNOSIS — R030 Elevated blood-pressure reading, without diagnosis of hypertension: Secondary | ICD-10-CM | POA: Diagnosis not present

## 2020-04-18 DIAGNOSIS — K8020 Calculus of gallbladder without cholecystitis without obstruction: Secondary | ICD-10-CM | POA: Diagnosis not present

## 2020-04-18 NOTE — Progress Notes (Signed)
Virtual Visit via Video Note  I connected with@ on 04/20/20 at 11:30 AM EDT by a video enabled telemedicine application and verified that I am speaking with the correct person using two identifiers. Location patient: home Location provider:work  office Persons participating in the virtual visit: patient, provider  WIth national recommendations  regarding COVID 19 pandemic   video visit is advised over in office visit for this patient.  Patient aware  of the limitations of evaluation and management by telemedicine and  availability of in person appointments. and agreed to proceed.   HPI: Dana Bailey presents for video visit she is doing well but did have 1 more tach not as bad as the previous 1 took water and it went away. No vomiting diarrhea. She is taking her blood pressure readings see my chart messaging.  Very good in morning in the afternoon tends to go up to the 1 4150 range.  150/80 range. She feels that her pain episode is really related to eating.    ROS: See pertinent positives and negatives per HPI.  Past Medical History:  Diagnosis Date  . Allergy    dust,pollen,ragweed,oak,pine  . Chicken pox   . Congenital heart defect   . Hay fever   . Hepatitis     hepatitis C from blood transfusion  . 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 High blood pressure    white coat syndrome  . Hx of echocardiogram 2013   nl except 2 diastolic dysfunction nl lv   no pht,no asd  . Hx of heart surgery    asd closure  . Kidney stones 5277,8242  . Need for rhogam due to Rh negative mother    x 2 1982 and 1986  . Shingles 12/2011  . Transfusion history 1974   4 units  1 from father  heart surgery    Past Surgical History:  Procedure Laterality Date  . ASD REPAIR   1974   Age 51  . KIDNEY STONE SURGERY     right kidney  . LITHOTRIPSY  2013  . OVARIAN CYST REMOVAL  1998   Laparoscopic 1998  . TONSILLECTOMY AND ADENOIDECTOMY  1962    Family History  Problem Relation Age of Onset  . Arthritis Mother        Degenerative disc disease  . Hyperlipidemia Mother   . Hypertension Mother   . Kidney disease Mother   . Mental illness Mother   . Arthritis Father   . Hyperlipidemia Father   . Heart disease Father   . Stroke Father   . Hypertension Father   . Diabetes Father   . Alzheimer's disease Father   . Glaucoma Other   . Macular degeneration Other   . Breast cancer Sister   . Colon cancer Neg Hx     Social History   Tobacco Use  . Smoking status: Never Smoker  . Smokeless tobacco: Never Used  Substance Use Topics  . Alcohol use: Yes    Comment: rarely  . Drug use: No      Current Outpatient Medications:  .  AMOXICILLIN PO, Take 2,000 mg by mouth. 2000mg  po x 1 prior to denal procedures, Disp: , Rfl:  .  fexofenadine (ALLEGRA) 180 MG tablet, Take 180 mg by mouth daily., Disp: , Rfl:  .  Naproxen Sodium (ALEVE PO), Take by mouth., Disp: , Rfl:  .  sodium chloride (OCEAN) 0.65 % SOLN nasal spray, Place 1 spray into both nostrils as needed for congestion., Disp: , Rfl:   Current Facility-Administered Medications:  .  0.9 %  sodium chloride infusion, 500 mL, Intravenous, Continuous, Pyrtle, Lajuan Lines, MD  EXAM: BP Readings from Last 3 Encounters:  03/28/20 (!) 160/90  03/11/20 (!) 157/72  02/17/18 138/78   Follow-up blood pressure 150/80 not done today.  Has a.m. blood pressures in the 120 range see my chart  Readings  VITALS per patient if applicable:  GENERAL: alert, oriented, appears well and in no acute distress  HEENT: atraumatic, conjunttiva clear, no obvious abnormalities on inspection of external nose and ears  NECK: normal movements of the head and neck  LUNGS: on inspection no signs of respiratory distress, breathing  rate appears normal, no obvious gross SOB, gasping or wheezing  CV: no obvious cyanosis  MS: moves all visible extremities without noticeable abnormality  PSYCH/NEURO: pleasant and cooperative, no obvious depression or anxiety, speech and thought processing grossly intact Lab Results  Component Value Date   WBC 7.5 03/11/2020   HGB 14.2 03/11/2020   HCT 41.6 03/11/2020   PLT 293 03/11/2020   GLUCOSE 84 04/19/2020   CHOL 204 (H) 01/26/2018   TRIG 124.0 01/26/2018   HDL 47.10 01/26/2018   LDLCALC 132 (H) 01/26/2018   ALT 24 03/28/2020   AST 27 03/28/2020   NA 140 04/19/2020   K 4.3 04/19/2020   CL 102 04/19/2020   CREATININE 0.70 04/19/2020   BUN 13 04/19/2020   CO2 27 04/19/2020   TSH 2.21 03/28/2020   Family hx of  Parents with ht .  abd [place] ok x gallstones  ASSESSMENT AND PLAN:  Discussed the following assessment and plan:    ICD-10-CM   1. Elevated blood pressure reading  Q68.3 BASIC METABOLIC PANEL WITH GFR    Cortisol-am, blood    Aldosterone + renin activity w/ ratio  2. Hypokalemia  M19.6 BASIC METABOLIC PANEL WITH GFR    Cortisol-am, blood    Aldosterone + renin activity w/ ratio   better   3. Gallstones  K80.20    most likely cause of  epigastric pain    Elevated blood pressure probably require medication.  Her potassium is repleted plan a.m. cortisol renin aldosterone level and depending on results may begin medication.  We discussed surgical referral may hold off for now unless gets more symptoms. Counseled.   Expectant management and discussion of plan and treatment with opportunity to ask questions and all were answered. The patient agreed with the plan and demonstrated an understanding of the instructions.  calendar  Sx  For now  Consider surgery referral  Plan lab  Early   bp monitoring   3 days per week at least and send in  We may add medication depending   Advised to call back or seek an in-person evaluation if worsening  or having  further  concerns . In interim  Return for depending on labs and bp and .   Shanon Ace, MD

## 2020-04-19 ENCOUNTER — Other Ambulatory Visit: Payer: Self-pay

## 2020-04-19 ENCOUNTER — Other Ambulatory Visit: Payer: 59

## 2020-04-19 DIAGNOSIS — R030 Elevated blood-pressure reading, without diagnosis of hypertension: Secondary | ICD-10-CM

## 2020-04-19 DIAGNOSIS — E876 Hypokalemia: Secondary | ICD-10-CM

## 2020-04-27 LAB — BASIC METABOLIC PANEL WITH GFR
BUN: 13 mg/dL (ref 7–25)
CO2: 27 mmol/L (ref 20–32)
Calcium: 9.6 mg/dL (ref 8.6–10.4)
Chloride: 102 mmol/L (ref 98–110)
Creat: 0.7 mg/dL (ref 0.50–0.99)
GFR, Est African American: 106 mL/min/{1.73_m2} (ref >=60)
GFR, Est Non African American: 92 mL/min/{1.73_m2} (ref >=60)
Glucose, Bld: 84 mg/dL (ref 65–99)
Potassium: 4.3 mmol/L (ref 3.5–5.3)
Sodium: 140 mmol/L (ref 135–146)

## 2020-04-27 LAB — CORTISOL-AM, BLOOD: Cortisol - AM: 22.5 ug/dL — ABNORMAL HIGH

## 2020-04-27 LAB — ALDOSTERONE + RENIN ACTIVITY W/ RATIO
ALDO / PRA Ratio: 20 Ratio (ref 0.9–28.9)
Aldosterone: 9 ng/dL
Renin Activity: 0.45 ng/mL/h (ref 0.25–5.82)

## 2020-06-20 ENCOUNTER — Encounter: Payer: Self-pay | Admitting: Internal Medicine

## 2020-06-20 ENCOUNTER — Telehealth: Payer: 59 | Admitting: Internal Medicine

## 2020-06-20 VITALS — BP 143/79

## 2020-06-20 DIAGNOSIS — K8020 Calculus of gallbladder without cholecystitis without obstruction: Secondary | ICD-10-CM

## 2020-06-20 DIAGNOSIS — E876 Hypokalemia: Secondary | ICD-10-CM

## 2020-06-20 DIAGNOSIS — I10 Essential (primary) hypertension: Secondary | ICD-10-CM

## 2020-06-20 DIAGNOSIS — Z13220 Encounter for screening for lipoid disorders: Secondary | ICD-10-CM

## 2020-06-20 DIAGNOSIS — R7989 Other specified abnormal findings of blood chemistry: Secondary | ICD-10-CM

## 2020-06-20 DIAGNOSIS — Z823 Family history of stroke: Secondary | ICD-10-CM

## 2020-06-20 MED ORDER — LOSARTAN POTASSIUM 50 MG PO TABS: 25.0000 mg | ORAL_TABLET | Freq: Every day | ORAL | 3 refills | Status: DC

## 2020-06-20 NOTE — Telephone Encounter (Signed)
See visit  Jun 20 2020

## 2020-06-20 NOTE — Progress Notes (Signed)
Virtual Visit via Video Note  I connected with@ on 06/21/20 at 10:30 AM EST by a video enabled telemedicine application and verified that I am speaking with the correct person using two identifiers. Location patient: home Location provider:work  office Persons participating in the virtual visit: patient, provider  [place] national recommendations  regarding COVID 19 pandemic   video visit is advised over in office visit for this patient.  Patient aware  of the limitations of evaluation and management by telemedicine and  availability of in person appointments. and agreed to proceed.   HPI: Dana Bailey presents for video visit follow-up of blood pressure readings epigastric pain event that took her to the hospital in November. Since that time she has had 1 little minor episode of abdominal pain.  Nothing since that is doing well. She did have blood work done that showed borderline elevated a.m. cortisol and a normal PRA ratio.  Her potassium on repeat is normal. Since then she has monitored her blood pressure readings and lifestyle intervention attention. Her readings are averaging mild elevation.  143/79 range she did take her reading recently after significant activity 153/107. Family history hypertension and cva   is willing to add medication.   HT bothparents  ROS: See pertinent positives and negatives per HPI.  Past Medical History:  Diagnosis Date  . Allergy    dust,pollen,ragweed,oak,pine  . Chicken pox   . Congenital heart defect   . Hay fever   . Hepatitis     hepatitis C from blood transfusion  . 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 High blood pressure    white coat syndrome  . Hx of echocardiogram 2013   nl except 2 diastolic dysfunction nl lv   no  pht,no asd  . Hx of heart surgery    asd closure  . Kidney stones [id number]  . Need for rhogam due to Rh negative mother    x 2 1982 and 1986  . Shingles 12/2011  . Transfusion history 1974   4 units  1 from father  heart surgery    Past Surgical History:  Procedure Laterality Date  . ASD REPAIR  1974   Age 65  . KIDNEY STONE SURGERY     right kidney  . LITHOTRIPSY  2013  . OVARIAN CYST REMOVAL  1998   Laparoscopic 1998  . TONSILLECTOMY AND ADENOIDECTOMY  1962    Family History  Problem Relation Age of Onset  . Arthritis Mother        Degenerative disc disease  . Hyperlipidemia Mother   . Hypertension Mother   . Kidney disease Mother   . Mental illness Mother   . Arthritis Father   . Hyperlipidemia Father   . Heart disease Father   . Stroke Father   . Hypertension Father   . Diabetes Father   . Alzheimer's disease Father   . Glaucoma Other   . Macular degeneration Other   . Breast cancer Sister   . Colon cancer Neg Hx     Social History   Tobacco Use  . Smoking status: Never Smoker  . Smokeless tobacco: Never Used  Substance Use Topics  . Alcohol use: Yes  Comment: rarely  . Drug use: No      Current Outpatient Medications:  .  losartan (COZAAR) 50 MG tablet, Take 0.5 tablets (25 mg total) by mouth daily. May increase to 50 mg per day as directed, Disp: 30 tablet, Rfl: 3 .  AMOXICILLIN PO, Take 2,000 mg by mouth. 2000mg  po x 1 prior to denal procedures, Disp: , Rfl:  .  fexofenadine (ALLEGRA) 180 MG tablet, Take 180 mg by mouth daily., Disp: , Rfl:  .  Naproxen Sodium (ALEVE PO), Take by mouth., Disp: , Rfl:   Current Facility-Administered Medications:  .  0.9 %  sodium chloride infusion, 500 mL, Intravenous, Continuous, Pyrtle, Lajuan Lines, MD  EXAM: BP Readings from Last 3 Encounters:  06/20/20 (!) 143/79  03/28/20 (!) 160/90  03/11/20 (!) 157/72    VITALS per patient if applicable: as above  Hx   GENERAL: alert, oriented, appears well and in no  acute distress  HEENT: atraumatic, conjunttiva clear, no obvious abnormalities on inspection of external nose and ears  NECK: normal movements of the head and neck  LUNGS: on inspection no signs of respiratory distress, breathing rate appears normal, no obvious gross SOB, gasping or wheezing  CV: no obvious cyanosis  MS: moves all visible extremities without noticeable abnormality  PSYCH/NEURO: pleasant and cooperative, no obvious depression or anxiety, speech and thought processing grossly intact Lab Results  Component Value Date   WBC 7.5 03/11/2020   HGB 14.2 03/11/2020   HCT 41.6 03/11/2020   PLT 293 03/11/2020   GLUCOSE 84 04/19/2020   CHOL 204 (H) 01/26/2018   TRIG 124.0 01/26/2018   HDL 47.10 01/26/2018   LDLCALC 132 (H) 01/26/2018   ALT 24 03/28/2020   AST 27 03/28/2020   NA 140 04/19/2020   K 4.3 04/19/2020   CL 102 04/19/2020   CREATININE 0.70 04/19/2020   BUN 13 04/19/2020   CO2 27 04/19/2020   TSH 2.21 03/28/2020   Aldosterone renin ratio is normal cortisol   borderline elevated 22.5 test seems stable.  Last November.  ASSESSMENT AND PLAN:  Discussed the following assessment and plan:    ICD-10-CM   1. Essential hypertension  I10 Cortisol, Free 24 HR Urine    Lipid panel    Basic metabolic panel  2. Elevated cortisol level  R79.89 Cortisol, Free 24 HR Urine    Lipid panel    Basic metabolic panel  3. Hypokalemia  E87.6 Lipid panel    Basic metabolic panel   Resolved  4. Lipid screening  Z13.220 Lipid panel    Basic metabolic panel  5. Family hx-stroke  Z82.3 Lipid panel    Basic metabolic panel  6. Gall stones  K80.20    prob cause of abd pain currentlly quiescent.   Epigastric pain has faded no recurrence at this time doing well with this. Blood pressure readings average 143/79 in the recent past with some elevations 153/107. With family history of stroke and attention to lifestyle we need to be more aggressive on blood pressure control. I do  not think the elevated cortisol borderline is probably significant but will follow up with 24-hour urinary free cortisol. Begin losartan 25 mg a day with the option to increase to 50 mg a day. Plan fasting lab work in 1 month and then either video visit or our [place] depending on how doing. Can contact us in the interim if needed.  Or questions. Counseled.   Expectant management and discussion of plan and treatment  with opportunity to ask questions and all were answered. The patient agreed with the plan and demonstrated an understanding of the instructions.   Advised to call back or seek an in-person evaluation if worsening  or having  further concerns . Return in about 1 month (around 07/21/2020) for Fasting lab 1 month then our OV or virtual visit blood pressure and lab.Berniece Andreas, MD

## 2020-07-03 NOTE — Telephone Encounter (Signed)
Thanks for the update   Increase to 50 mg per day of losartan  Send in readings after  2-4 weeks   Please have lab help arrange the 24 hour urine ay free cortisol   Thanks

## 2020-07-12 LAB — BASIC METABOLIC PANEL: Creatinine: 0.9 (ref <=1.1)

## 2020-07-12 NOTE — Addendum Note (Signed)
Addended by: Marrion Coy on: 07/12/2020 03:21 PM   Modules accepted: Orders

## 2020-07-13 ENCOUNTER — Other Ambulatory Visit: Payer: Self-pay

## 2020-07-13 ENCOUNTER — Other Ambulatory Visit (INDEPENDENT_AMBULATORY_CARE_PROVIDER_SITE_OTHER): Payer: 59

## 2020-07-13 DIAGNOSIS — E876 Hypokalemia: Secondary | ICD-10-CM

## 2020-07-13 DIAGNOSIS — I10 Essential (primary) hypertension: Secondary | ICD-10-CM

## 2020-07-13 DIAGNOSIS — Z823 Family history of stroke: Secondary | ICD-10-CM

## 2020-07-13 DIAGNOSIS — Z13220 Encounter for screening for lipoid disorders: Secondary | ICD-10-CM

## 2020-07-13 DIAGNOSIS — R7989 Other specified abnormal findings of blood chemistry: Secondary | ICD-10-CM | POA: Diagnosis not present

## 2020-07-13 LAB — LIPID PANEL
Cholesterol: 198 mg/dL (ref 0–200)
HDL: 49.7 mg/dL (ref >39.00)
LDL Cholesterol: 129 mg/dL — ABNORMAL HIGH (ref 0–99)
NonHDL: 147.89
Total CHOL/HDL Ratio: 4
Triglycerides: 92 mg/dL (ref 0.0–149.0)
VLDL: 18.4 mg/dL (ref 0.0–40.0)

## 2020-07-13 LAB — BASIC METABOLIC PANEL
BUN: 14 mg/dL (ref 6–23)
CO2: 27 mEq/L (ref 19–32)
Calcium: 9.4 mg/dL (ref 8.4–10.5)
Chloride: 105 mEq/L (ref 96–112)
Creatinine, Ser: 0.72 mg/dL (ref 0.40–1.20)
GFR: 88.17 mL/min (ref >60.00)
Glucose, Bld: 95 mg/dL (ref 70–99)
Potassium: 4.1 mEq/L (ref 3.5–5.1)
Sodium: 139 mEq/L (ref 135–145)

## 2020-07-15 NOTE — Progress Notes (Signed)
Potassium normal  Did we get the 24 hour urinary cortisol collected and results? Please update on bp readings

## 2020-07-19 ENCOUNTER — Other Ambulatory Visit: Payer: 59

## 2020-07-20 NOTE — Telephone Encounter (Signed)
So readings  Are not  Too bad but not yet at goal  Which is best below  130/80 and below .  Ir may continue to improve with more time and continued attention to life style  Interventions  If you are feeling ok then  Continue losartan 50 per day and   Plan fu readings  And assessment in another month. ( virtual or  Contact )  We can adjust medication at that time if needed  Also lab so far,  Including potassium, is normal  .

## 2020-07-22 NOTE — Telephone Encounter (Signed)
Ok to be virtual for March follow up.

## 2020-07-27 NOTE — Telephone Encounter (Signed)
Please have staff contact lab and see what happened to the rusults

## 2020-07-30 ENCOUNTER — Telehealth: Payer: Self-pay

## 2020-07-30 LAB — CORTISOL, FREE 24 HR URINE
CREATININE, URINE: 0.94 g/(24.h) (ref 0.50–2.15)
Cortisol (Ur), Free: 10 mcg/24 h (ref 4.0–50.0)
Cortisol, Free ratio to CRT: 10.6 mcg/g creat
Total Volume: 1750 mL

## 2020-07-30 NOTE — Telephone Encounter (Addendum)
Received my chart message from patient about 24 hour urine results not being posted in chart  Contacted lab tech D.M about 24 hour urine results.  Following is the reply  Ok they are getting it put into the system and also going to fax it to be sure it gets here. [facility] could not tell me why it was not put into the results in the chart but it should be viewable soon :)   Messaged patient back with information.

## 2020-07-31 ENCOUNTER — Encounter: Payer: Self-pay | Admitting: Internal Medicine

## 2020-08-06 NOTE — Telephone Encounter (Signed)
Noted  

## 2020-09-29 ENCOUNTER — Other Ambulatory Visit: Payer: Self-pay | Admitting: Internal Medicine

## 2020-11-30 NOTE — Telephone Encounter (Signed)
Thanks for the update  Was out of office last week. Bp seems perfect range!.  Continue   what you are doing  to  keep it in good range.      Can make  a cpx preventive visit in  the fall or   4  and fu then

## 2021-07-11 ENCOUNTER — Other Ambulatory Visit: Payer: Self-pay | Admitting: Internal Medicine

## 2022-02-13 ENCOUNTER — Other Ambulatory Visit: Payer: Self-pay | Admitting: Internal Medicine

## 2022-02-13 IMAGING — MG DIGITAL SCREENING BILAT W/ CAD
4 series · 4 of 4 positions shown · non-contrast
Comparison: Previous exam(s).

CLINICAL DATA: Screening.

EXAM:
DIGITAL SCREENING BILATERAL MAMMOGRAM WITH CAD

[L CC]
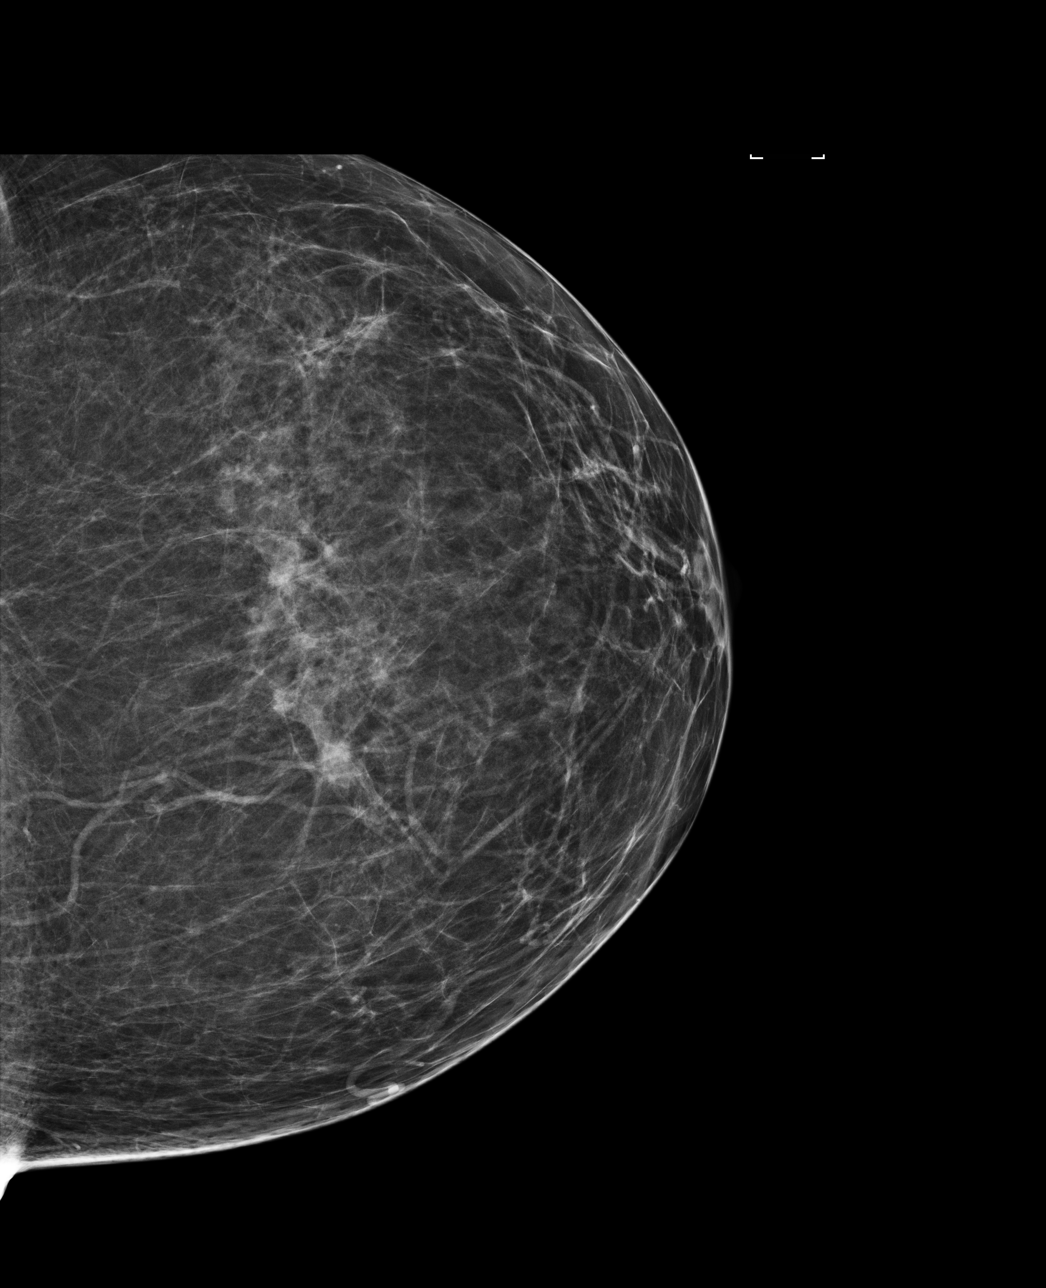

[L MLO]
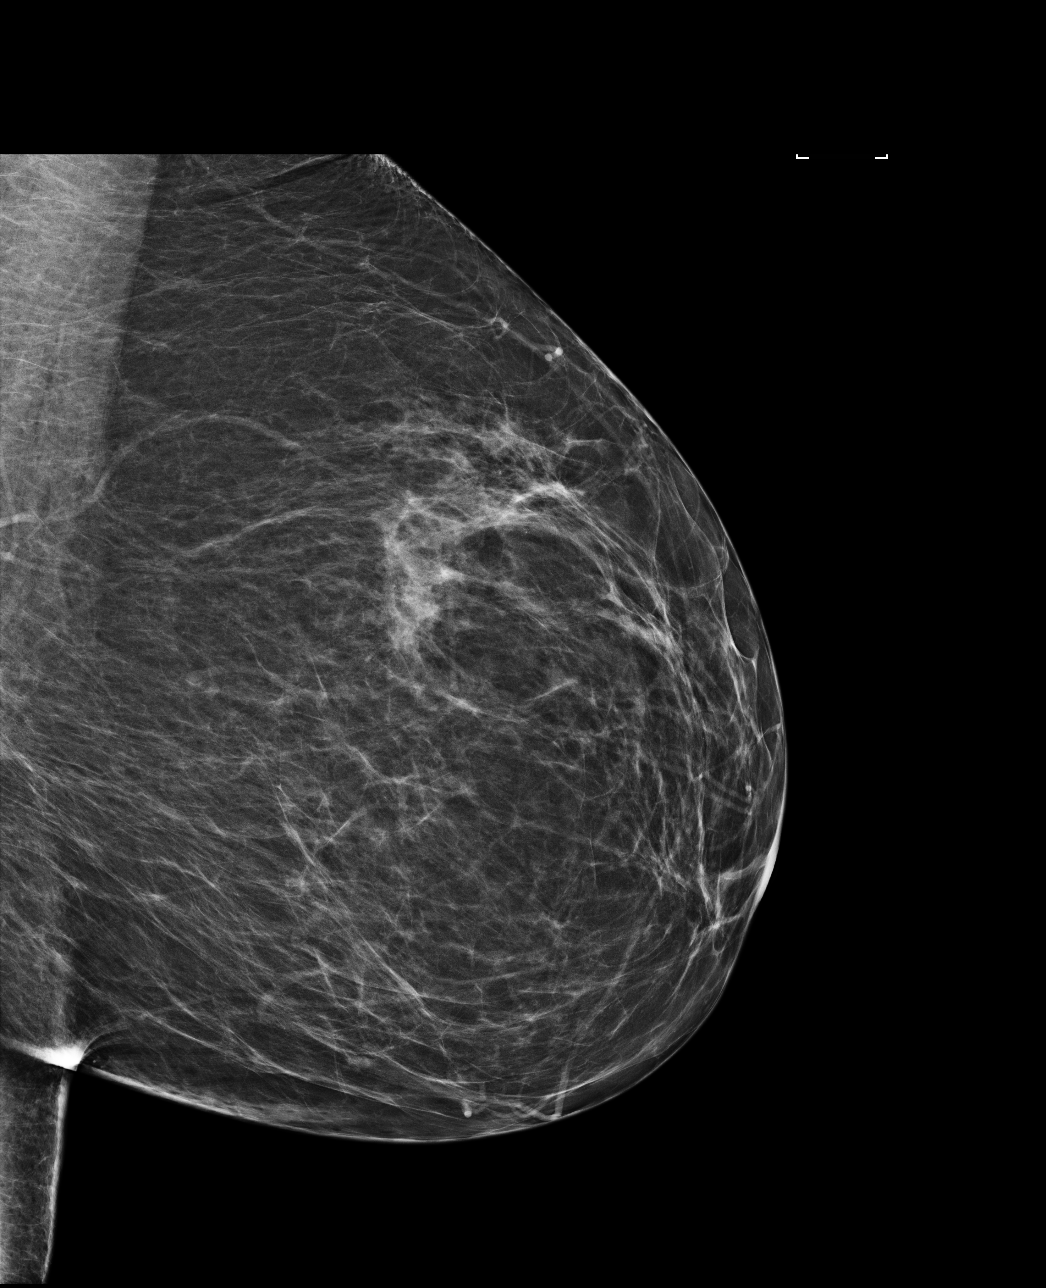

[R CC]
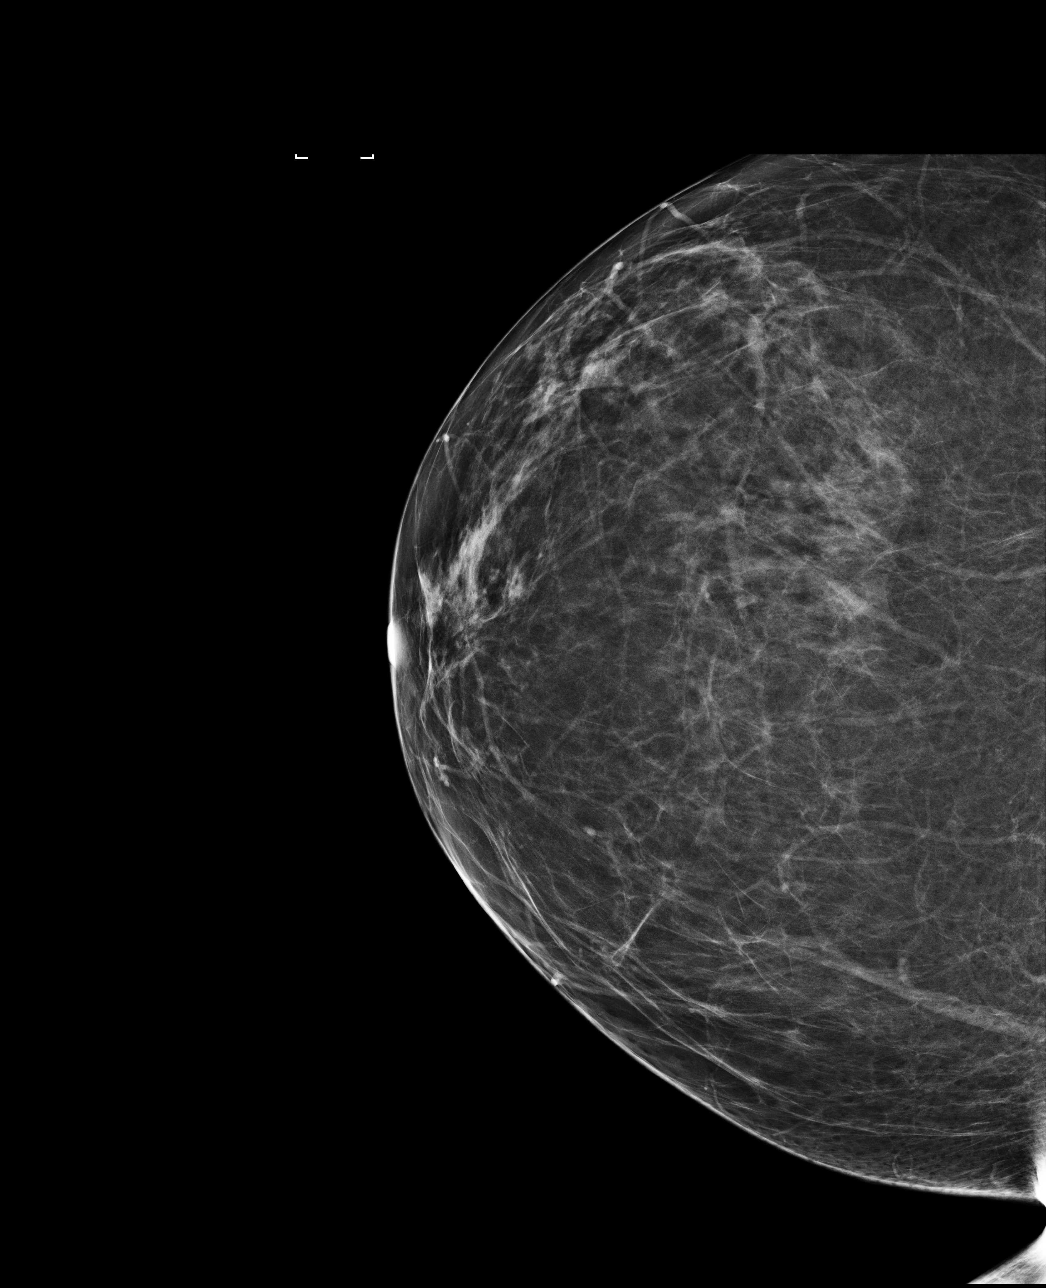

[R MLO]
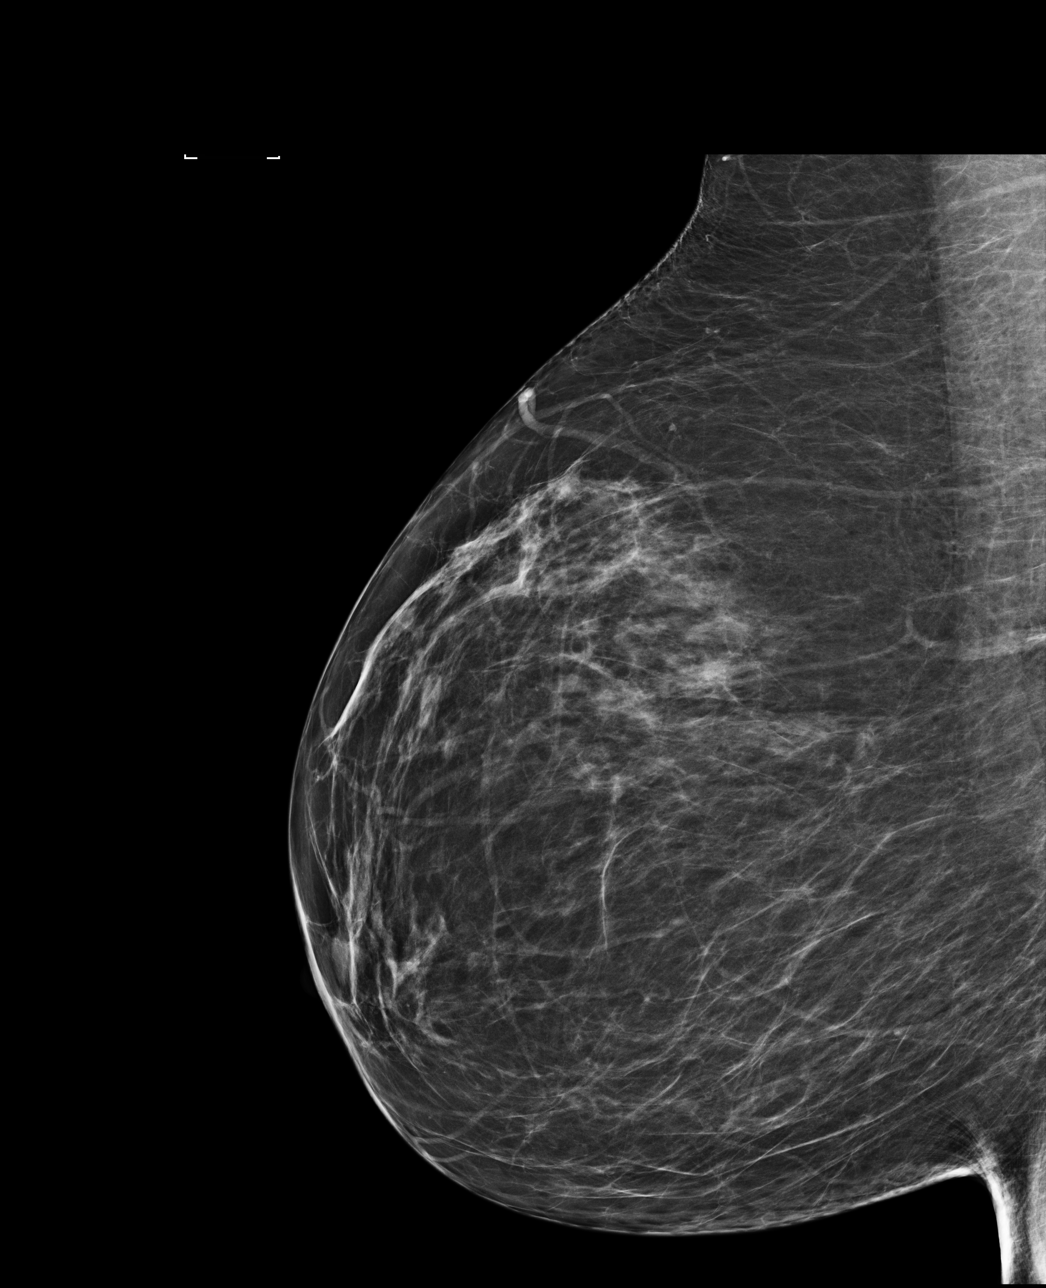

[4 of 4 positions shown; findings below may reference images not displayed]

ACR Breast Density Category b: There are scattered areas of
fibroglandular density.
FINDINGS: There are no findings suspicious for malignancy. Images were
processed with CAD.
IMPRESSION: No mammographic evidence of malignancy. A result letter of this
screening mammogram will be mailed directly to the patient.

RECOMMENDATION:
Screening mammogram in one year. (Code:AS-G-LCT)

BI-RADS CATEGORY  1: Negative.

## 2022-02-23 MED ORDER — LOSARTAN POTASSIUM 50 MG PO TABS: ORAL_TABLET | ORAL | 0 refills | Status: DC

## 2022-02-23 NOTE — Telephone Encounter (Signed)
I sent in refill electronically  She can do a virtual visit   if running out  of this rx before gets  new PCP

## 2022-02-23 NOTE — Addendum Note (Signed)
Addended byBurnis Medin on: 02/23/2022 03:36 PM   Modules accepted: Orders

## 2022-05-12 IMAGING — CR DG CHEST 2V
2 series · 2 of 2 positions shown · non-contrast
Comparison: None.

CLINICAL DATA: Chest pain

EXAM:
CHEST - 2 VIEW

[chest pa]
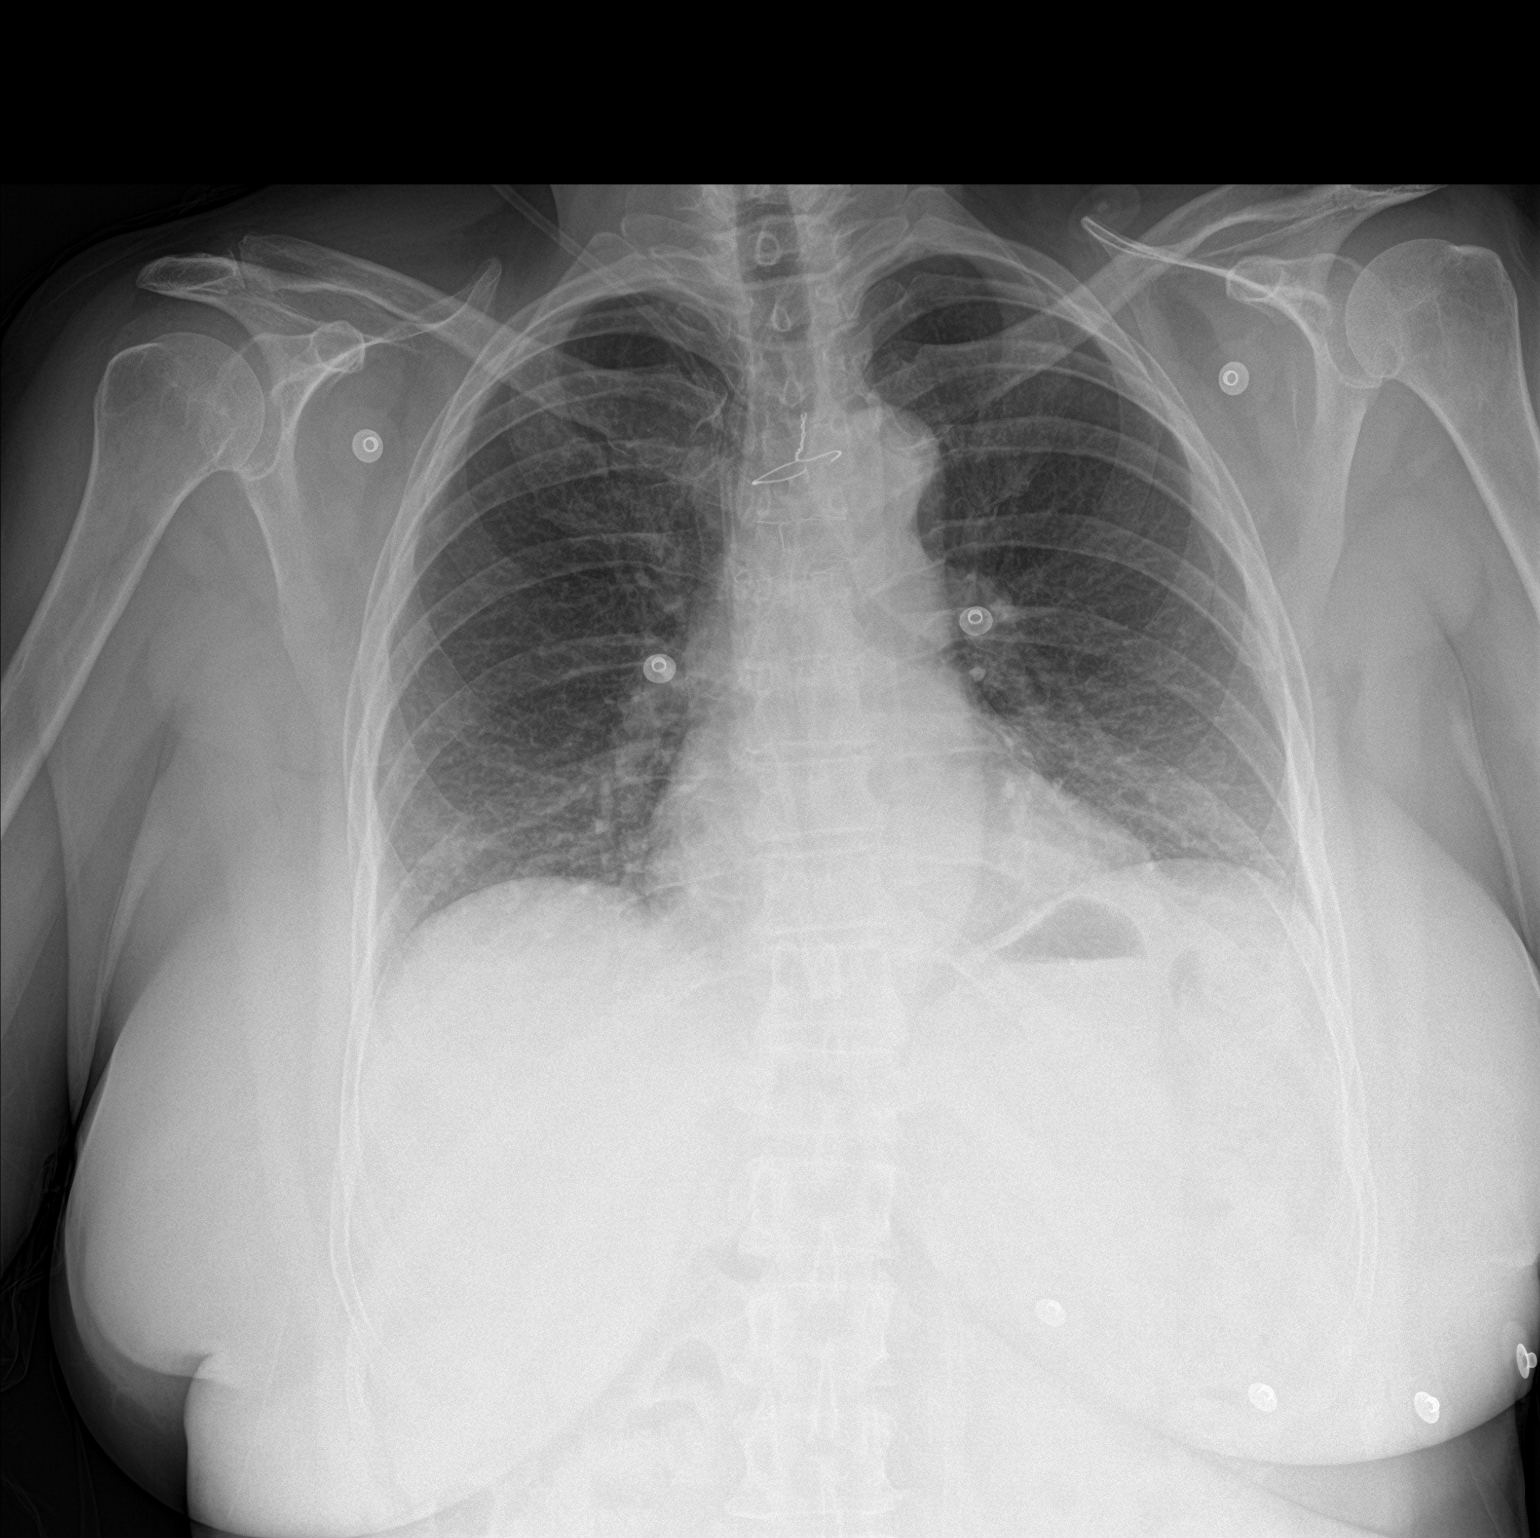

[chest lat]
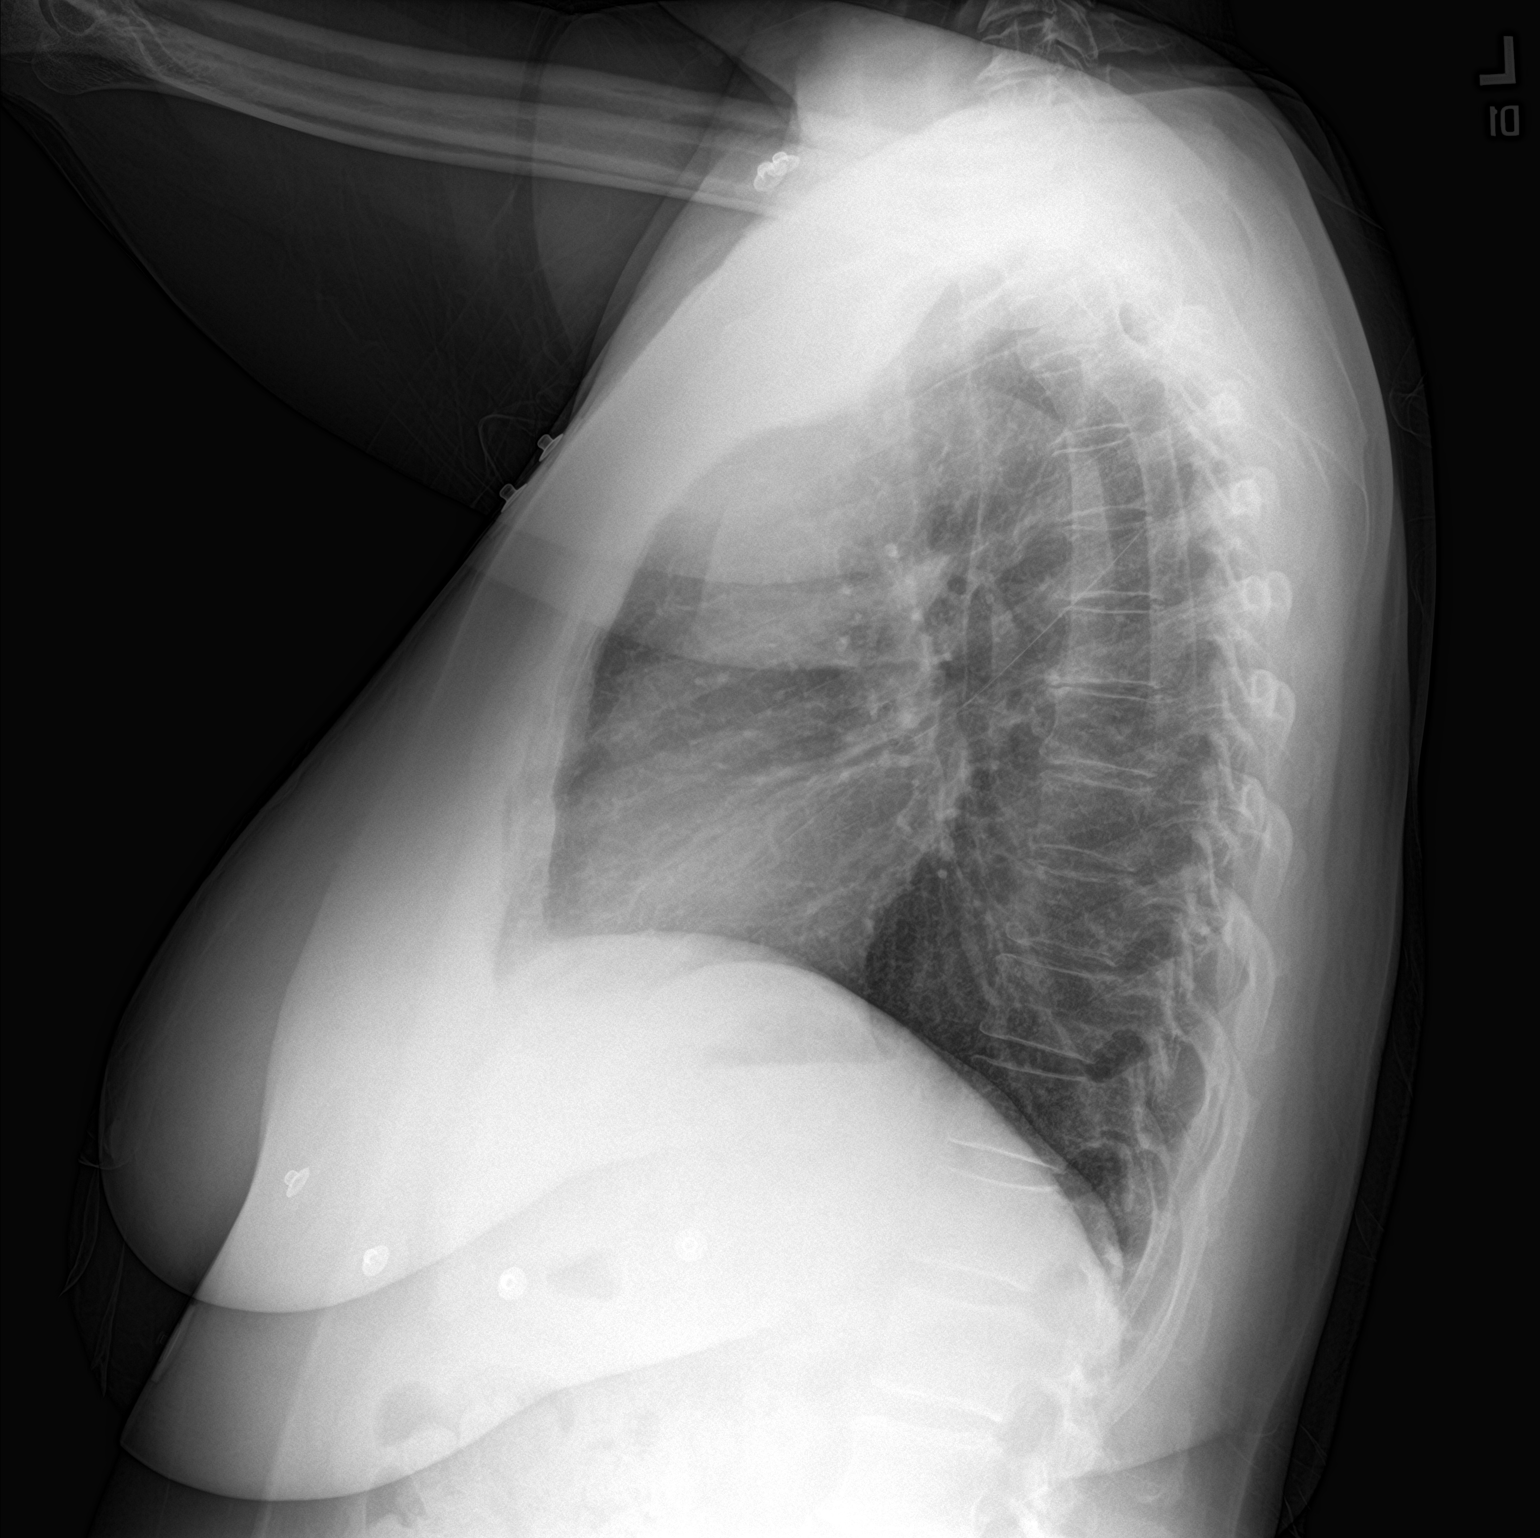

[2 of 2 positions shown; findings below may reference images not displayed]

FINDINGS: The heart size and mediastinal contours are within normal limits.
Both lungs are clear. The visualized skeletal structures are
unremarkable.
IMPRESSION: No active cardiopulmonary disease.

## 2022-05-22 ENCOUNTER — Other Ambulatory Visit: Payer: Self-pay | Admitting: Internal Medicine

## 2022-06-07 IMAGING — US US ABDOMEN COMPLETE
1 series · 14 of 25 positions shown · non-contrast
Comparison: None.

CLINICAL DATA: Epigastric pain.

EXAM:
ABDOMEN ULTRASOUND COMPLETE

[Series 1: us abdomen complete · 0.15mm/px · 14 of 78 slices shown]
[im 1/78]
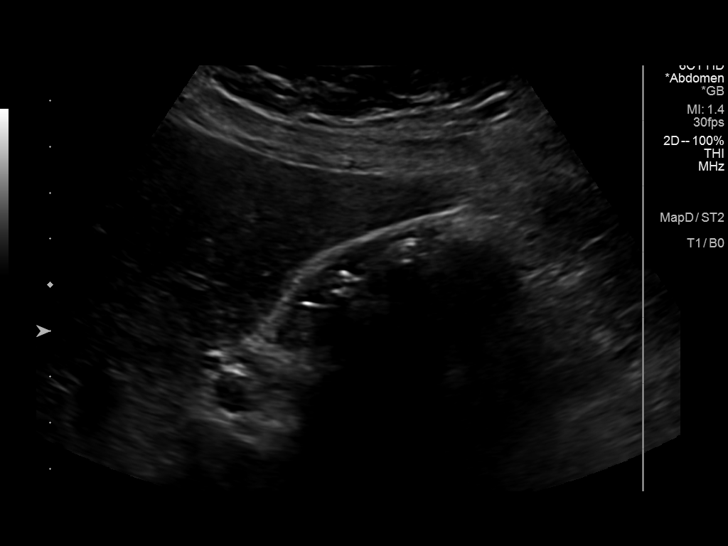
[im 7/78]
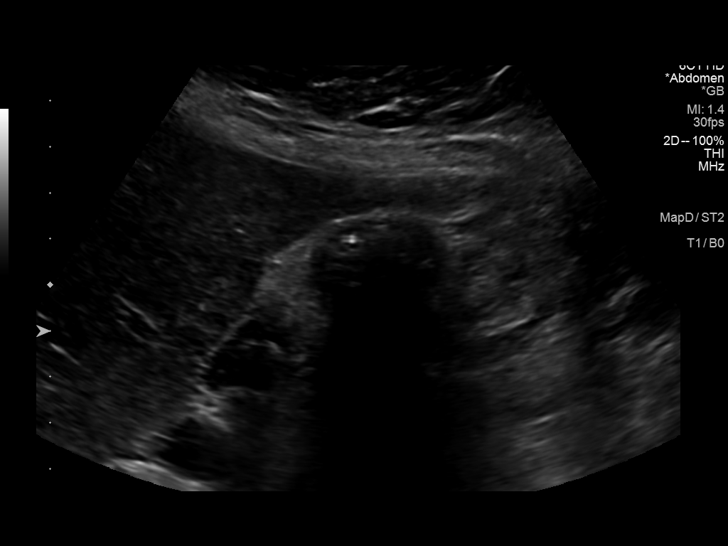
[im 13/78]
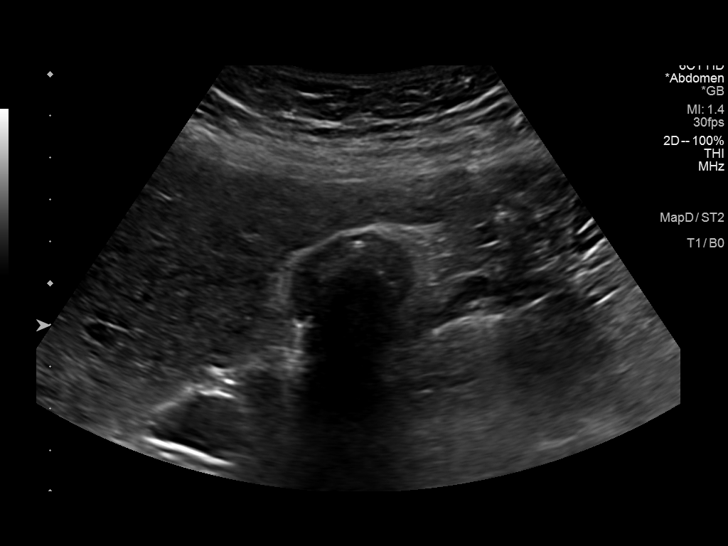
[im 20/78]
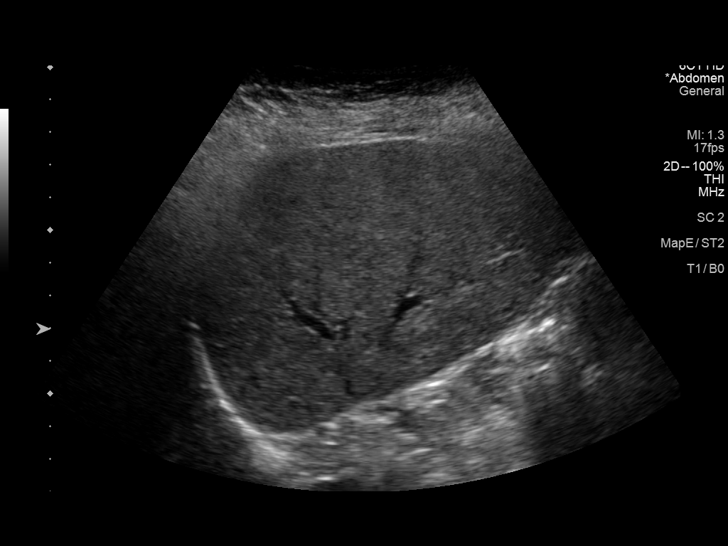
[im 26/78]
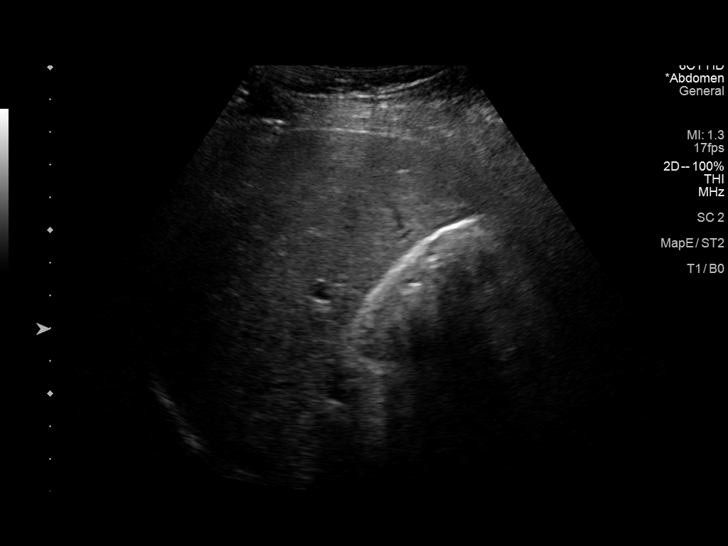
[im 29/78]
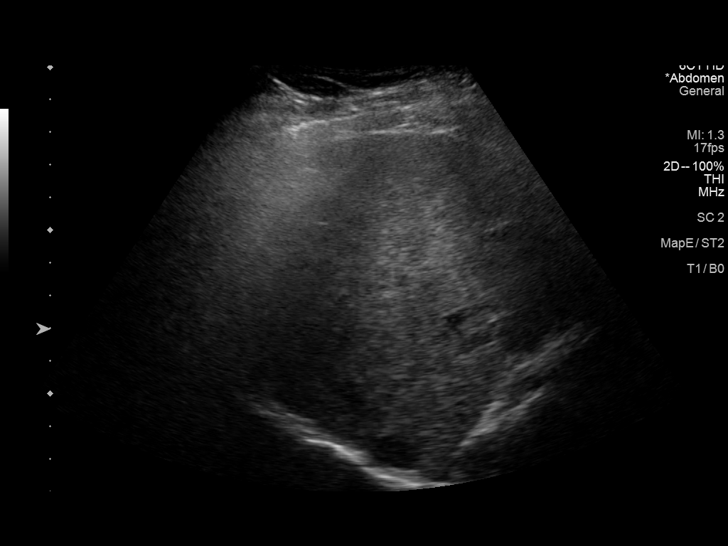
[im 36/78]
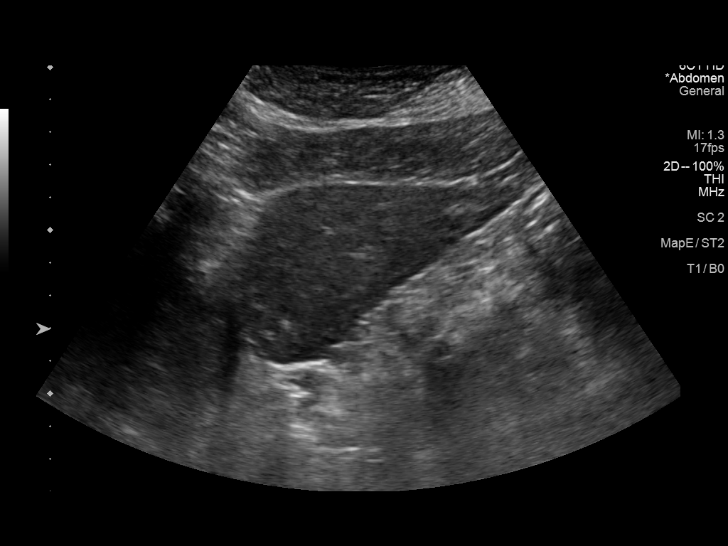
[im 42/78]
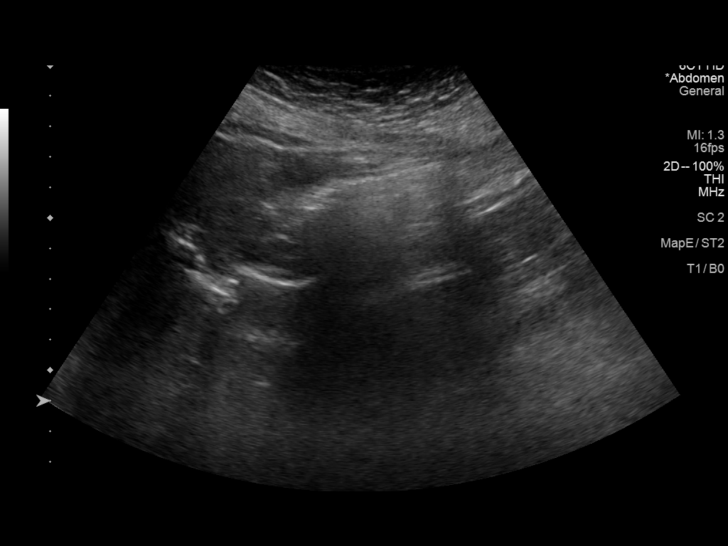
[im 49/78]
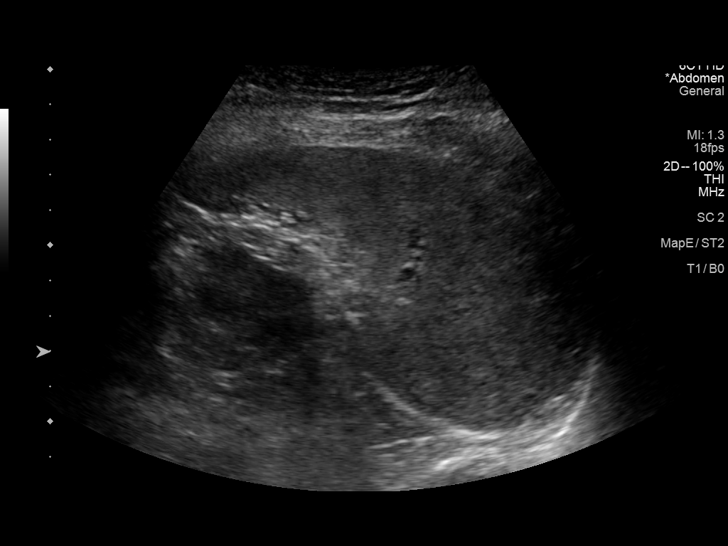
[im 52/78]
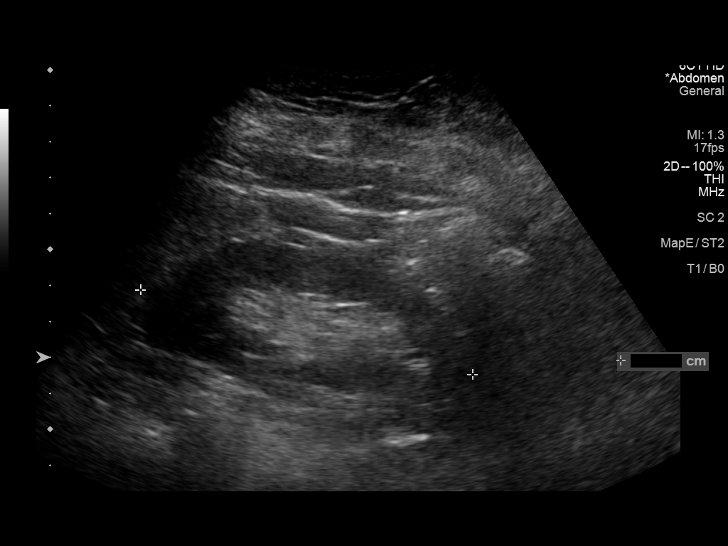
[im 58/78]
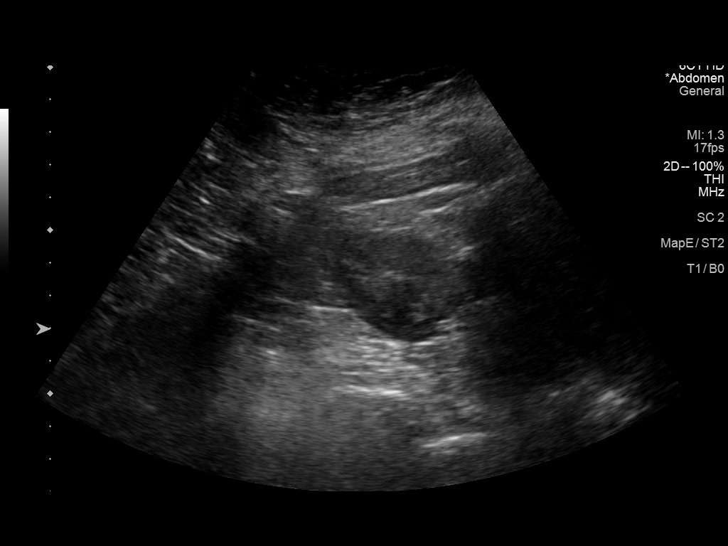
[im 65/78]
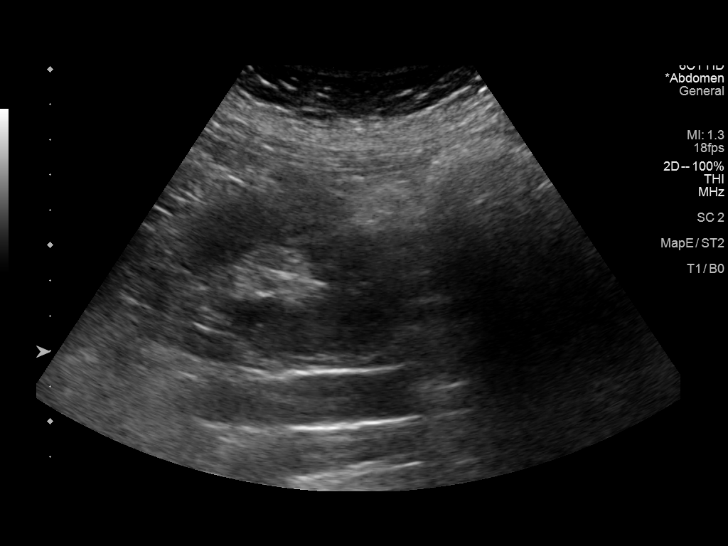
[im 71/78]
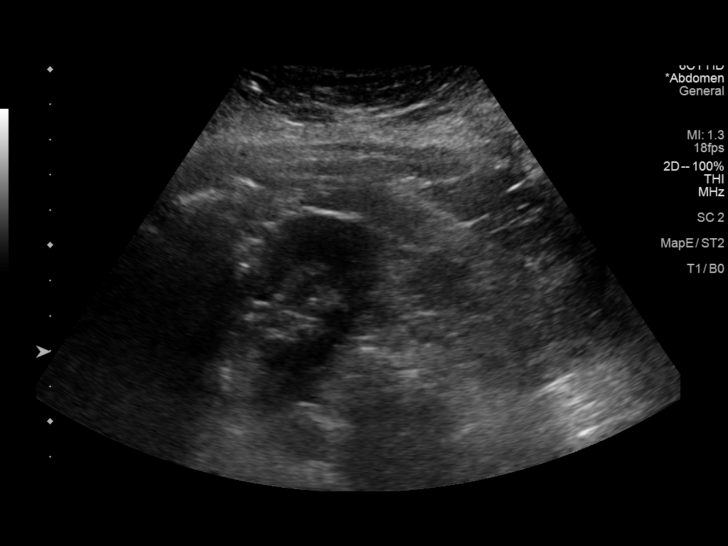
[im 78/78]
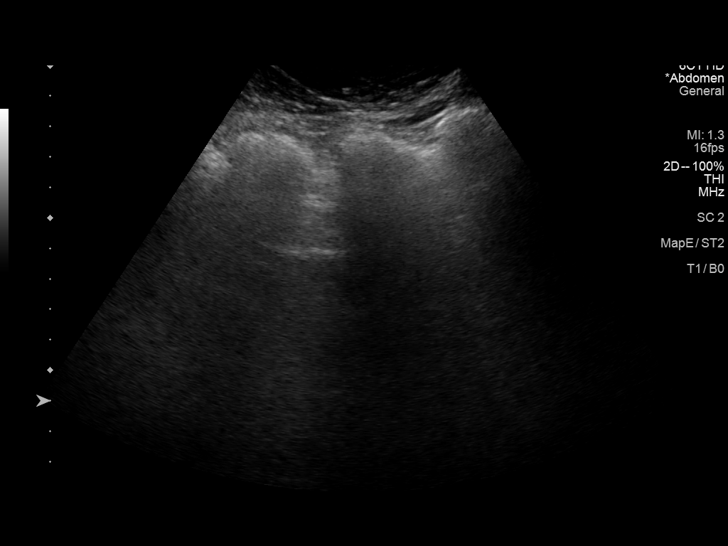

[14 of 25 positions shown; findings below may reference images not displayed]

FINDINGS: Gallbladder: Multiple shadowing echogenic nonmobile gallstones are
seen. The largest measures approximately 1.0 cm. The gallbladder
wall measures 3.0 mm. No sonographic Murphy sign noted by
sonographer.

Common bile duct: Diameter: 2.0 mm

Liver: No focal lesion identified. The parenchymal echogenicity is
mildly heterogeneous in appearance. Portal vein is patent on color
Doppler imaging with normal direction of blood flow towards the
liver.

IVC: Limited in visualization secondary to overlying bowel gas.

Pancreas: There is limited visualization of the tail of the pancreas
secondary to overlying bowel gas.

Spleen: Size (11.7 cm) and appearance within normal limits.

Right Kidney: Length: 9.5 cm. Echogenicity within normal limits. No
mass or hydronephrosis visualized.

Left Kidney: Length: 9.1 cm. Echogenicity within normal limits. No
mass or hydronephrosis visualized.

Abdominal aorta: No aneurysm visualized.

Other findings: None.
IMPRESSION: Cholelithiasis without evidence of acute cholecystitis.

## 2022-08-20 ENCOUNTER — Other Ambulatory Visit: Payer: Self-pay | Admitting: Internal Medicine

## 2022-08-20 NOTE — Telephone Encounter (Signed)
Spoke to pt. Pt inform the prescription to be sent out to her new provider. She is working on reaching out to her PCP to send the refill. Inform her that we will cancel this request. Verbalized understanding.
